# Patient Record
Sex: Female | Born: 1940 | Race: White | Hispanic: No | Marital: Married | Smoking: Never smoker
Health system: Southern US, Community
[De-identification: ages and names within clinical notes are randomized; demographics above are authoritative.]

## PROBLEM LIST (undated history)

## (undated) DIAGNOSIS — T4145XA Adverse effect of unspecified anesthetic, initial encounter: Secondary | ICD-10-CM

## (undated) DIAGNOSIS — K449 Diaphragmatic hernia without obstruction or gangrene: Secondary | ICD-10-CM

## (undated) DIAGNOSIS — K5790 Diverticulosis of intestine, part unspecified, without perforation or abscess without bleeding: Secondary | ICD-10-CM

## (undated) DIAGNOSIS — Z9889 Other specified postprocedural states: Secondary | ICD-10-CM

## (undated) DIAGNOSIS — I809 Phlebitis and thrombophlebitis of unspecified site: Secondary | ICD-10-CM

## (undated) DIAGNOSIS — M1990 Unspecified osteoarthritis, unspecified site: Secondary | ICD-10-CM

## (undated) DIAGNOSIS — R112 Nausea with vomiting, unspecified: Secondary | ICD-10-CM

## (undated) DIAGNOSIS — T8859XA Other complications of anesthesia, initial encounter: Secondary | ICD-10-CM

## (undated) DIAGNOSIS — M62838 Other muscle spasm: Secondary | ICD-10-CM

## (undated) DIAGNOSIS — I10 Essential (primary) hypertension: Secondary | ICD-10-CM

## (undated) DIAGNOSIS — M810 Age-related osteoporosis without current pathological fracture: Secondary | ICD-10-CM

## (undated) HISTORY — PX: JOINT REPLACEMENT: SHX530

## (undated) HISTORY — DX: Other muscle spasm: M62.838

## (undated) HISTORY — PX: REPLACEMENT TOTAL KNEE: SUR1224

## (undated) HISTORY — PX: TONSILLECTOMY: SUR1361

---

## 1999-03-04 ENCOUNTER — Other Ambulatory Visit: Admission: RE | Admit: 1999-03-04 | Discharge: 1999-03-04 | Payer: Self-pay | Admitting: *Deleted

## 1999-11-07 ENCOUNTER — Other Ambulatory Visit: Admission: RE | Admit: 1999-11-07 | Discharge: 1999-11-07 | Payer: Self-pay | Admitting: *Deleted

## 1999-11-07 ENCOUNTER — Encounter (INDEPENDENT_AMBULATORY_CARE_PROVIDER_SITE_OTHER): Payer: Self-pay | Admitting: Specialist

## 1999-12-03 ENCOUNTER — Encounter: Admission: RE | Admit: 1999-12-03 | Discharge: 2000-01-28 | Payer: Self-pay | Admitting: Family Medicine

## 2001-05-19 ENCOUNTER — Other Ambulatory Visit: Admission: RE | Admit: 2001-05-19 | Discharge: 2001-05-19 | Payer: Self-pay | Admitting: *Deleted

## 2001-05-31 ENCOUNTER — Ambulatory Visit (HOSPITAL_COMMUNITY): Admission: RE | Admit: 2001-05-31 | Discharge: 2001-05-31 | Payer: Self-pay | Admitting: Gastroenterology

## 2001-05-31 ENCOUNTER — Encounter (INDEPENDENT_AMBULATORY_CARE_PROVIDER_SITE_OTHER): Payer: Self-pay | Admitting: Specialist

## 2002-08-14 ENCOUNTER — Other Ambulatory Visit: Admission: RE | Admit: 2002-08-14 | Discharge: 2002-08-14 | Payer: Self-pay | Admitting: *Deleted

## 2003-10-24 ENCOUNTER — Other Ambulatory Visit: Admission: RE | Admit: 2003-10-24 | Discharge: 2003-10-24 | Payer: Self-pay | Admitting: *Deleted

## 2004-10-08 ENCOUNTER — Ambulatory Visit: Payer: Self-pay | Admitting: Family Medicine

## 2004-12-25 ENCOUNTER — Other Ambulatory Visit: Admission: RE | Admit: 2004-12-25 | Discharge: 2004-12-25 | Payer: Self-pay | Admitting: *Deleted

## 2005-06-12 ENCOUNTER — Encounter: Admission: RE | Admit: 2005-06-12 | Discharge: 2005-06-12 | Payer: Self-pay | Admitting: Internal Medicine

## 2005-07-15 ENCOUNTER — Ambulatory Visit: Payer: Self-pay | Admitting: "Endocrinology

## 2005-10-09 ENCOUNTER — Encounter: Admission: RE | Admit: 2005-10-09 | Discharge: 2005-10-09 | Payer: Self-pay | Admitting: Internal Medicine

## 2005-10-12 ENCOUNTER — Ambulatory Visit: Payer: Self-pay | Admitting: "Endocrinology

## 2005-12-29 ENCOUNTER — Other Ambulatory Visit: Admission: RE | Admit: 2005-12-29 | Discharge: 2005-12-29 | Payer: Self-pay | Admitting: *Deleted

## 2006-01-18 ENCOUNTER — Ambulatory Visit: Payer: Self-pay | Admitting: "Endocrinology

## 2006-05-19 ENCOUNTER — Ambulatory Visit: Payer: Self-pay | Admitting: "Endocrinology

## 2006-08-23 ENCOUNTER — Ambulatory Visit: Payer: Self-pay | Admitting: "Endocrinology

## 2006-11-24 ENCOUNTER — Ambulatory Visit: Payer: Self-pay | Admitting: "Endocrinology

## 2007-01-05 ENCOUNTER — Other Ambulatory Visit: Admission: RE | Admit: 2007-01-05 | Discharge: 2007-01-05 | Payer: Self-pay | Admitting: *Deleted

## 2007-06-28 ENCOUNTER — Ambulatory Visit: Payer: Self-pay | Admitting: "Endocrinology

## 2008-02-15 ENCOUNTER — Other Ambulatory Visit: Admission: RE | Admit: 2008-02-15 | Discharge: 2008-02-15 | Payer: Self-pay | Admitting: *Deleted

## 2008-02-22 ENCOUNTER — Ambulatory Visit: Payer: Self-pay | Admitting: "Endocrinology

## 2008-06-19 ENCOUNTER — Ambulatory Visit: Payer: Self-pay | Admitting: "Endocrinology

## 2008-06-21 ENCOUNTER — Encounter: Admission: RE | Admit: 2008-06-21 | Discharge: 2008-06-21 | Payer: Self-pay | Admitting: "Endocrinology

## 2008-12-26 ENCOUNTER — Ambulatory Visit: Payer: Self-pay | Admitting: "Endocrinology

## 2009-05-29 ENCOUNTER — Ambulatory Visit: Payer: Self-pay | Admitting: "Endocrinology

## 2009-11-18 ENCOUNTER — Inpatient Hospital Stay (HOSPITAL_COMMUNITY): Admission: RE | Admit: 2009-11-18 | Discharge: 2009-11-20 | Payer: Self-pay | Admitting: Orthopedic Surgery

## 2010-02-17 ENCOUNTER — Encounter: Admission: RE | Admit: 2010-02-17 | Discharge: 2010-02-17 | Payer: Self-pay | Admitting: Family Medicine

## 2010-04-08 ENCOUNTER — Ambulatory Visit: Payer: Self-pay | Admitting: "Endocrinology

## 2010-11-17 ENCOUNTER — Inpatient Hospital Stay (HOSPITAL_COMMUNITY)
Admission: RE | Admit: 2010-11-17 | Discharge: 2010-11-19 | Payer: Self-pay | Source: Home / Self Care | Attending: Orthopedic Surgery | Admitting: Orthopedic Surgery

## 2011-02-17 LAB — URINALYSIS, ROUTINE W REFLEX MICROSCOPIC
Bilirubin Urine: NEGATIVE
Glucose, UA: NEGATIVE mg/dL
Hgb urine dipstick: NEGATIVE
Ketones, ur: NEGATIVE mg/dL
Nitrite: NEGATIVE
Protein, ur: NEGATIVE mg/dL
Specific Gravity, Urine: 1.012 (ref 1.005–1.030)
Urobilinogen, UA: 0.2 mg/dL (ref 0.0–1.0)
pH: 7.5 (ref 5.0–8.0)

## 2011-02-17 LAB — TYPE AND SCREEN
ABO/RH(D): O POS
Antibody Screen: NEGATIVE

## 2011-02-17 LAB — DIFFERENTIAL
Basophils Absolute: 0.1 10*3/uL (ref 0.0–0.1)
Basophils Relative: 1 % (ref 0–1)
Eosinophils Absolute: 0.1 10*3/uL (ref 0.0–0.7)
Eosinophils Relative: 1 % (ref 0–5)
Lymphocytes Relative: 20 % (ref 12–46)
Lymphs Abs: 1.2 10*3/uL (ref 0.7–4.0)
Monocytes Absolute: 0.8 10*3/uL (ref 0.1–1.0)
Monocytes Relative: 12 % (ref 3–12)
Neutro Abs: 4.2 10*3/uL (ref 1.7–7.7)
Neutrophils Relative %: 66 % (ref 43–77)

## 2011-02-17 LAB — BASIC METABOLIC PANEL
BUN: 12 mg/dL (ref 6–23)
BUN: 7 mg/dL (ref 6–23)
CO2: 26 mEq/L (ref 19–32)
CO2: 27 mEq/L (ref 19–32)
Calcium: 8.1 mg/dL — ABNORMAL LOW (ref 8.4–10.5)
Calcium: 9.4 mg/dL (ref 8.4–10.5)
Chloride: 101 mEq/L (ref 96–112)
Chloride: 102 mEq/L (ref 96–112)
Creatinine, Ser: 0.8 mg/dL (ref 0.4–1.2)
Creatinine, Ser: 0.9 mg/dL (ref 0.4–1.2)
GFR calc Af Amer: >60 mL/min (ref >60)
GFR calc Af Amer: >60 mL/min (ref >60)
GFR calc non Af Amer: >60 mL/min (ref >60)
GFR calc non Af Amer: >60 mL/min (ref >60)
Glucose, Bld: 100 mg/dL — ABNORMAL HIGH (ref 70–99)
Glucose, Bld: 143 mg/dL — ABNORMAL HIGH (ref 70–99)
Potassium: 3.7 mEq/L (ref 3.5–5.1)
Potassium: 3.9 mEq/L (ref 3.5–5.1)
Sodium: 134 mEq/L — ABNORMAL LOW (ref 135–145)
Sodium: 135 mEq/L (ref 135–145)

## 2011-02-17 LAB — CBC
HCT: 32.5 % — ABNORMAL LOW (ref 36.0–46.0)
HCT: 39.5 % (ref 36.0–46.0)
Hemoglobin: 10.8 g/dL — ABNORMAL LOW (ref 12.0–15.0)
Hemoglobin: 13.3 g/dL (ref 12.0–15.0)
MCH: 30.6 pg (ref 26.0–34.0)
MCH: 30.9 pg (ref 26.0–34.0)
MCHC: 33.2 g/dL (ref 30.0–36.0)
MCHC: 33.7 g/dL (ref 30.0–36.0)
MCV: 91.6 fL (ref 78.0–100.0)
MCV: 92.1 fL (ref 78.0–100.0)
Platelets: 214 10*3/uL (ref 150–400)
Platelets: 217 10*3/uL (ref 150–400)
Platelets: 267 10*3/uL (ref 150–400)
RBC: 3.49 MIL/uL — ABNORMAL LOW (ref 3.87–5.11)
RBC: 3.53 MIL/uL — ABNORMAL LOW (ref 3.87–5.11)
RBC: 4.31 MIL/uL (ref 3.87–5.11)
RDW: 14.2 % (ref 11.5–15.5)
RDW: 14.4 % (ref 11.5–15.5)
RDW: 14.4 % (ref 11.5–15.5)
WBC: 5.1 10*3/uL (ref 4.0–10.5)
WBC: 5.6 10*3/uL (ref 4.0–10.5)
WBC: 6.3 10*3/uL (ref 4.0–10.5)

## 2011-02-17 LAB — ANAEROBIC CULTURE

## 2011-02-17 LAB — SURGICAL PCR SCREEN
MRSA, PCR: NEGATIVE
Staphylococcus aureus: NEGATIVE

## 2011-02-17 LAB — APTT: aPTT: 28 seconds (ref 24–37)

## 2011-02-17 LAB — WOUND CULTURE: Culture: NO GROWTH

## 2011-02-17 LAB — GRAM STAIN

## 2011-02-17 LAB — PROTIME-INR
INR: 1.02 (ref 0.00–1.49)
INR: 1.06 (ref 0.00–1.49)
INR: 1.59 — ABNORMAL HIGH (ref 0.00–1.49)
Prothrombin Time: 13.6 seconds (ref 11.6–15.2)

## 2011-03-10 LAB — DIFFERENTIAL
Basophils Relative: 1 % (ref 0–1)
Lymphocytes Relative: 19 % (ref 12–46)
Lymphs Abs: 1.3 10*3/uL (ref 0.7–4.0)
Monocytes Relative: 8 % (ref 3–12)
Neutro Abs: 4.8 10*3/uL (ref 1.7–7.7)
Neutrophils Relative %: 69 % (ref 43–77)

## 2011-03-10 LAB — URINALYSIS, ROUTINE W REFLEX MICROSCOPIC
Bilirubin Urine: NEGATIVE
Nitrite: NEGATIVE
Specific Gravity, Urine: 1.015 (ref 1.005–1.030)
Urobilinogen, UA: 0.2 mg/dL (ref 0.0–1.0)

## 2011-03-10 LAB — CBC
HCT: 34.7 % — ABNORMAL LOW (ref 36.0–46.0)
HCT: 35 % — ABNORMAL LOW (ref 36.0–46.0)
Hemoglobin: 12.3 g/dL (ref 12.0–15.0)
MCHC: 34.5 g/dL (ref 30.0–36.0)
MCHC: 34.8 g/dL (ref 30.0–36.0)
MCHC: 35.1 g/dL (ref 30.0–36.0)
MCV: 92.7 fL (ref 78.0–100.0)
MCV: 92.9 fL (ref 78.0–100.0)
Platelets: 221 10*3/uL (ref 150–400)
RBC: 4.4 MIL/uL (ref 3.87–5.11)
RDW: 13.4 % (ref 11.5–15.5)
RDW: 13.5 % (ref 11.5–15.5)
WBC: 6.9 10*3/uL (ref 4.0–10.5)

## 2011-03-10 LAB — BASIC METABOLIC PANEL
CO2: 29 mEq/L (ref 19–32)
Calcium: 9.5 mg/dL (ref 8.4–10.5)
Calcium: 8.4 mg/dL (ref 8.4–10.5)
Creatinine, Ser: 0.91 mg/dL (ref 0.4–1.2)
GFR calc Af Amer: >60 mL/min (ref >60)
GFR calc non Af Amer: >60 mL/min (ref >60)
Glucose, Bld: 133 mg/dL — ABNORMAL HIGH (ref 70–99)
Potassium: 4.2 mEq/L (ref 3.5–5.1)
Sodium: 134 mEq/L — ABNORMAL LOW (ref 135–145)

## 2011-03-10 LAB — TYPE AND SCREEN: Antibody Screen: NEGATIVE

## 2011-03-10 LAB — PROTIME-INR: INR: 1.04 (ref 0.00–1.49)

## 2011-03-10 LAB — APTT: aPTT: 31 seconds (ref 24–37)

## 2011-03-31 ENCOUNTER — Other Ambulatory Visit: Payer: Self-pay | Admitting: *Deleted

## 2011-03-31 ENCOUNTER — Encounter: Payer: Self-pay | Admitting: *Deleted

## 2011-03-31 DIAGNOSIS — M810 Age-related osteoporosis without current pathological fracture: Secondary | ICD-10-CM | POA: Insufficient documentation

## 2011-03-31 DIAGNOSIS — E049 Nontoxic goiter, unspecified: Secondary | ICD-10-CM | POA: Insufficient documentation

## 2011-03-31 DIAGNOSIS — E229 Hyperfunction of pituitary gland, unspecified: Secondary | ICD-10-CM | POA: Insufficient documentation

## 2011-03-31 DIAGNOSIS — E782 Mixed hyperlipidemia: Secondary | ICD-10-CM

## 2011-03-31 DIAGNOSIS — E785 Hyperlipidemia, unspecified: Secondary | ICD-10-CM | POA: Insufficient documentation

## 2011-04-29 ENCOUNTER — Ambulatory Visit (INDEPENDENT_AMBULATORY_CARE_PROVIDER_SITE_OTHER): Payer: BC Managed Care – PPO | Admitting: "Endocrinology

## 2011-04-29 DIAGNOSIS — E049 Nontoxic goiter, unspecified: Secondary | ICD-10-CM

## 2011-04-29 DIAGNOSIS — M810 Age-related osteoporosis without current pathological fracture: Secondary | ICD-10-CM

## 2011-04-29 DIAGNOSIS — E559 Vitamin D deficiency, unspecified: Secondary | ICD-10-CM

## 2011-04-29 DIAGNOSIS — E211 Secondary hyperparathyroidism, not elsewhere classified: Secondary | ICD-10-CM

## 2011-04-29 NOTE — Patient Instructions (Signed)
Please increase increase your dietary calcium as you are able to.

## 2011-06-27 NOTE — Progress Notes (Signed)
Chief complaint: Followup osteoporosis, secondary hyperparathyroidism, hypocalcemia, hyperlipidemia, and goiter  History of present illness: The patient is a 70 year old Caucasian woman. 1. the patient was referred to me on 8/09/ 06 for evaluation of osteoporosis by her primary care provider Dr. [name] M.D. She developed bilateral hip joint pains in the late 1990s. In 1996, Dr. [name] of [facility], ordered a bone mineral density study. That study showed significant osteoporosis in the spine and in both hips.  The patient was treated with calcium carbonate and with a multivitamin that included vitamin D. Despite taking these medications and having a reasonably robust exercise regimen, the patient's osteoporosis worsened. In 1999 Dr. [name]  performed bilateral hip replacements. Treatment with Fosamax was begun at about that time. In the intervening years bone mineral density had not changed significantly. She was treated with Boniva for several months, but just forgot to take it. Her past medical history revealed that she had had arthritis in many joints for many years. She had undergone natural menopause about 20 years prior. She did take estrogen for one or 2 years. Her surgeries were bilateral hip or placements and bilateral vein stripping was. 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 Family history revealed that 5 of her 6 siblings had osteoporosis. Two of those had celiac disease when younger. A sister and brother have had thyroid problems as well. Her physical examination was completely unremarkable. Laboratory data revealed that her PTH was 75.5, which was at the upper limit of normal. Her calcium was 8.7, which was in the lower half. Her 25 hydroxy vitamin D was 39 which was normal. Her 125 vitamin D was 51 which was also normal. I felt that she had relative hyperparathyroidism due to relatively low calcium and vitamin D intake. I asked  her to increase her calcium and vitamin D intake. On subsequent testing on 09/30/2005, her PTH was 37.0, calcium 9.3, 25-vitamin D 47, and 1,25-vitamin D 55.  2. In the interim the patient has has done well overall. Because it was unclear how long she should really remain on Fosamax treatment, I recommended that she obtain a second opinion from Dr. Gwendolyn Grant at [facility]. She saw Dr. Valarie Cones in June of 2009. He he recommended that she not be on Fosamax or Boniva treatment. He felt that she had derived all of the benefit from Fosamax that she could. He recommended that she continue her calcium, vitamin D, and exercise. The patient's last visit here was on 04/08/2010. Since then she's had a left hip revision procedure performed. She continues to have followup visits with Dr. [name] in orthopedics. A bone mineral density study performed on 02/16/2011 showed that her bone mineral density probably declined about 2.4-3.7% in the last 3 years.  3.PROS: Constitutional: The patient feels well, is healthy, and has no significant complaints. Eyes: Vision is good. There are no significant eye complaints. Neck: The patient has no complaints of anterior neck swelling, soreness, tenderness,  pressure, discomfort, or difficulty swallowing. She continues to have pains in her posterior neck. Heart: Heart rate increases with exercise or other physical activity. The patient has no complaints of palpitations, irregular heat beats, chest pain, or chest pressure. Gastrointestinal: Bowel movents seem normal. The patient has no complaints of excessive hunger, acid reflux, upset stomach, stomach aches or pains, diarrhea, or constipation. Legs: Muscle mass and strength seem normal. There are no complaints of numbness, tingling,  or burning. She has occasional leg muscle pains after walking. No edema is noted. Feet: There are no obvious foot problems. There are no complaints of numbness,  tingling, burning, or pain. No edema is noted.  PMFSH: 1. The patient had a recent trip to [place]. 2. 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  ROS: There are no other significant problems involving her other six body systems.She did have some dental work done two hours before this visit.  PHYSICAL EXAM: BP 147/85  Pulse 59  Wt 160 lb 4.8 oz (72.712 kg) Constitutional: The patient looks healthy and appears physically and emotionally well. The patient is alert, bright, and very mentally sharp. Eyes: There is no arcus or proptosis.  Mouth: The oropharynx appears normal. The tongue appears normal. There is normal oral moisture. There is no obvious gingivitis. Neck: There are no bruits present. The thyroid gland appears normal in size. The thyroid gland is approximately 20 grams in size. The consistency of the thyroid gland is normal. There is no thyroid tenderness to palpation.The trapezius muscles of the shoulders and the nuchal cords of the posterior neck are quite tight. Lungs: The lungs are clear. Air movement is good. Heart: The heart rhythm and rate appear normal. Heart sounds S1 and S2 are normal. I do not appreciate any pathologic heart murmurs. Abdomen: The abdominal size is normal. Bowel sounds are normal. The abdomen is soft and non-tender. There is no obviously palpable hepatomegaly, splenomegaly, or other masses.  Arms: Muscle mass appears appropriate for age.  Hands: There is no obvious tremor. Phalangeal and metacarpophalangeal joints appear normal. Palms are normal. Legs: Muscle mass appears appropriate for age. There is no edema.  Neurologic: Muscle strength is normal for age and gender  in both the upper and the lower extremities. Muscle tone appears normal. Sensation to touch is normal in the legs and feet.   Labs: 03/27/2011 - Normal TFTs, PTH, calcium, 25-vitamin D, and 1.25-Vitamin D  BMD: The patient's bone mineral density study performed on 02/16/2011 show that she  has lost approximately 2.4-3.7% of mineral density in the past year.  ASSESSMENT: 1. Osteoporosis: The patient osteoporosis is slightly worse. However, based on recommendations of her bone mineral density specialist, Dr. Valarie Cones, we have not resumed treatment with bisphosphonates. I did recommend that she contact Dr. Herma Ard office and arrange a FU appointment with him. 2. Vitamin D deficiency: Vitamin D levels are currently stable. 3. Secondary hyperparathyroidism: Her parathyroid hormone levels are now well-controlled. 4. Goiter: Thyroid size is stable. She remains euthyroid. 5. Hypocalcemia: The patient's serum calcium remains in the third quartile. I would like to see it somewhat higher over time to provide her adequate amounts of calcium in case of GI illness.  PLAN: 1. Diagnostic: Will repeat her laboratory values in 6 months. 2. Therapeutic: The patient will contact Dr. Valarie Cones to see if he would like to have a followup appointment. 3. Patient education: We discussed the current recommendations from the bone mineral density specialisst as to calcium and vitamin D intake. I've asked her to try to increase the amount of calcium which she obtains from natural sources. I also showed her some cervical spine and stretching exercises that she can use at home. 4. Follow-up: The patient will have a followup appointment in 6 months.  Level of Service: This visit lasted in excess of 40 minutes. More than 50% of the visit was devoted to counseling.

## 2011-07-19 ENCOUNTER — Encounter: Payer: Self-pay | Admitting: "Endocrinology

## 2011-10-26 ENCOUNTER — Ambulatory Visit: Payer: BC Managed Care – PPO | Admitting: "Endocrinology

## 2011-10-28 ENCOUNTER — Ambulatory Visit: Payer: BC Managed Care – PPO | Admitting: "Endocrinology

## 2011-11-01 ENCOUNTER — Emergency Department (HOSPITAL_COMMUNITY): Payer: BC Managed Care – PPO

## 2011-11-01 ENCOUNTER — Observation Stay (HOSPITAL_COMMUNITY)
Admission: EM | Admit: 2011-11-01 | Discharge: 2011-11-02 | Disposition: A | Discharge status: Home / Self Care | Payer: BC Managed Care – PPO | Attending: Orthopedic Surgery | Admitting: Orthopedic Surgery

## 2011-11-01 ENCOUNTER — Encounter (HOSPITAL_COMMUNITY): Payer: Self-pay | Admitting: Emergency Medicine

## 2011-11-01 DIAGNOSIS — S73005A Unspecified dislocation of left hip, initial encounter: Secondary | ICD-10-CM

## 2011-11-01 DIAGNOSIS — M25559 Pain in unspecified hip: Secondary | ICD-10-CM | POA: Insufficient documentation

## 2011-11-01 DIAGNOSIS — X500XXA Overexertion from strenuous movement or load, initial encounter: Secondary | ICD-10-CM | POA: Insufficient documentation

## 2011-11-01 DIAGNOSIS — T84029A Dislocation of unspecified internal joint prosthesis, initial encounter: Principal | ICD-10-CM | POA: Insufficient documentation

## 2011-11-01 DIAGNOSIS — Y92009 Unspecified place in unspecified non-institutional (private) residence as the place of occurrence of the external cause: Secondary | ICD-10-CM | POA: Insufficient documentation

## 2011-11-01 DIAGNOSIS — Z23 Encounter for immunization: Secondary | ICD-10-CM | POA: Insufficient documentation

## 2011-11-01 HISTORY — DX: Diaphragmatic hernia without obstruction or gangrene: K44.9

## 2011-11-01 MED ORDER — ONDANSETRON HCL 4 MG/2ML IJ SOLN
4.0000 mg | Freq: Once | INTRAMUSCULAR | Status: AC
  Administered 2011-11-01: 4 mg via INTRAVENOUS
  Filled 2011-11-01: qty 2

## 2011-11-01 MED ORDER — WHITE PETROLATUM GEL
Status: AC
  Administered 2011-11-01: 23:00:00
  Filled 2011-11-01: qty 5

## 2011-11-01 MED ORDER — PROPOFOL 10 MG/ML IV EMUL
5.0000 ug/kg/min | INTRAVENOUS | Status: DC
  Administered 2011-11-01: 170 mg via INTRAVENOUS
  Filled 2011-11-01: qty 40

## 2011-11-01 MED ORDER — PROMETHAZINE HCL 12.5 MG PO TABS: 12.5000 mg | ORAL_TABLET | Freq: Four times a day (QID) | ORAL | Status: DC | PRN

## 2011-11-01 MED ORDER — MORPHINE SULFATE 4 MG/ML IJ SOLN
4.0000 mg | Freq: Once | INTRAMUSCULAR | Status: AC
  Administered 2011-11-01: 4 mg via INTRAVENOUS

## 2011-11-01 MED ORDER — MORPHINE SULFATE 4 MG/ML IJ SOLN
4.0000 mg | Freq: Once | INTRAMUSCULAR | Status: AC
  Administered 2011-11-01: 4 mg via INTRAVENOUS
  Filled 2011-11-01: qty 1

## 2011-11-01 MED ORDER — PROMETHAZINE HCL 25 MG/ML IJ SOLN: 12.5000 mg | Freq: Four times a day (QID) | INTRAMUSCULAR | Status: DC | PRN

## 2011-11-01 MED ORDER — HYDROCODONE-ACETAMINOPHEN 5-325 MG PO TABS: 1.0000 | ORAL_TABLET | ORAL | Status: DC | PRN

## 2011-11-01 MED ORDER — ALUM & MAG HYDROXIDE-SIMETH 200-200-20 MG/5ML PO SUSP: 30.0000 mL | Freq: Four times a day (QID) | ORAL | Status: DC | PRN

## 2011-11-01 MED ORDER — ACETAMINOPHEN 325 MG PO TABS
650.0000 mg | ORAL_TABLET | Freq: Four times a day (QID) | ORAL | Status: DC | PRN
  Administered 2011-11-01: 650 mg via ORAL
  Filled 2011-11-01: qty 2

## 2011-11-01 MED ORDER — ACETAMINOPHEN 650 MG RE SUPP: 650.0000 mg | Freq: Four times a day (QID) | RECTAL | Status: DC | PRN

## 2011-11-01 NOTE — ED Notes (Signed)
5F foley cath placed with assistance from [place], EMT

## 2011-11-01 NOTE — ED Notes (Signed)
Per [place], gave patient, swabs, toothbrush, toothpaste and a small cup of water to use to rinse her mouth out.  No swallowing at all.

## 2011-11-01 NOTE — H&P (Signed)
Danielle Carson is an 70 y.o. female.   Chief Complaint: L hip pain  HPI: 70 yo F h/o L hip revision arthroplasty by Dr. Turner Carson 2 yrs ago.  Hiking today and leaned over to pick a rock out of shoe, felt pop and pain in L hip.  Transported to ED unable to walk.  Pain is moderate to severe.  History reviewed. No pertinent past medical history.  Past Surgical History  Procedure Date  . Joint replacement     bilat hip replacements and left hip revision    History reviewed. No pertinent family history. Social History:  reports that she has never smoked. She does not have any smokeless tobacco history on file. She reports that she drinks alcohol. She reports that she does not use illicit drugs.  Allergies:  Allergies  Allergen Reactions  . Aspirin     On very high doses    Medications Prior to Admission  Medication Dose Route Frequency Provider Last Rate Last Dose  . morphine 4 MG/ML injection 4 mg  4 mg Intravenous Once Danielle Chang, NP   4 mg at 11/01/11 1605  . morphine 4 MG/ML injection 4 mg  4 mg Intravenous Once Danielle Chang, NP   4 mg at 11/01/11 1748  . ondansetron (ZOFRAN) injection 4 mg  4 mg Intravenous Once Danielle Chang, NP   4 mg at 11/01/11 1604  . ondansetron (ZOFRAN) injection 4 mg  4 mg Intravenous Once Danielle Chang, NP   4 mg at 11/01/11 1748  . propofol (DIPRIVAN) 10 MG/ML infusion  5-70 mcg/kg/min Intravenous Titrated Danielle Chang, NP       Medications Prior to Admission  Medication Sig Dispense Refill  . calcium-vitamin D (OSCAL WITH D) 500-200 MG-UNIT per tablet Take 1 tablet by mouth 2 (two) times daily.        . Multiple Vitamin (MULTIVITAMIN) tablet Take 1 tablet by mouth daily.          No results found for this or any previous visit (from the past 48 hour(s)). Dg Hip Complete Left  11/01/2011  *RADIOLOGY REPORT*  Clinical Data: Left hip pain with popping.  LEFT HIP - COMPLETE 2+ VIEW  Comparison: 11/17/2010.  Findings:  Left hip arthroplasty.  The left femoral head is superiorly positioned with respect to the acetabular component. Obturator rings are poorly evaluated due to positioning.  IMPRESSION:  1.  Dislocated left hip arthroplasty. 2.  Obturator rings are poorly evaluated due to positioning. Difficult to exclude fracture.  Original Report Authenticated By: Danielle Carson, M.D.    Review of Systems  All other systems reviewed and are negative.    Blood pressure 135/66, pulse 85, temperature 97.6 F (36.4 C), temperature source Oral, resp. rate 16, height 5' 8.5" (1.74 m), weight 70.308 kg (155 lb), SpO2 99.00%. Physical Exam  Constitutional: She is oriented to person, place, and time. She appears well-developed and well-nourished.  HENT:  Head: Atraumatic.  Eyes: EOM are normal.  Neck: Neck supple.  Cardiovascular: Intact distal pulses.   Respiratory: Effort normal.  GI: Soft.  Musculoskeletal:       Left hip: She exhibits decreased range of motion, tenderness and swelling.  Neurological: She is alert and oriented to person, place, and time.  Skin: Skin is warm and dry.  Psychiatric: She has a normal mood and affect.     Assessment/Plan L prosthetic hip dislocation  -closed reduction in ED with propofol given by ED  doc -Admit for brace, pain control -Knee immobilizer tonight  Danielle Carson Danielle Carson 11/01/2011, 8:15 PM

## 2011-11-01 NOTE — Op Note (Signed)
NAME:  Danielle Carson, Danielle Carson   ACCOUNT NO.:  [id number]  MEDICAL RECORD NO.:  [id number]  LOCATION:  [id number]                        FACILITY:  [facility]  PHYSICIAN:  Jones Broom, MD    DATE OF BIRTH:  [birth year]  DATE OF PROCEDURE:  11/01/2011 DATE OF DISCHARGE:                              OPERATIVE REPORT   PREOPERATIVE DIAGNOSIS:  Left posterior dislocation, prosthetic hip.  POSTOPERATIVE DIAGNOSIS:  Left posterior dislocation, prosthetic hip.  PROCEDURE PERFORMED:  Closed reduction under conscious sedation.  Left hip posterior dislocation, prosthetic hip.  ATTENDING SURGEON:  Jones Broom, MD  ASSISTANT:  None.  ANESTHESIA:  Conscious sedation with propofol given by the ED physician.  COMPLICATIONS:  None.  DRAINS:  None.  SPECIMENS:  None.  ESTIMATED BLOOD LOSS:  None.  INDICATIONS FOR PROCEDURE:  The patient is a 70 year old female who had a left revision total hip arthroplasty done 2 years ago by Dr. [name].  She was doing well with that until today when she was hiking and bent over to pick up a rock out of her boot.  She can forward into internally rotated position, felt a pop and immediate pain.  She was seen in the emergency department where she was diagnosed with a left hip dislocation.  I was called for evaluation and management.  PROCEDURE:  The patient was identified in the emergency department.  I verified diagnosis based on x-rays and clinical exam.  She had received conscious sedation with propofol given by the emergency department physician.  Check complete relaxation.  With flexion traction and gentle internal subsequently external rotation, I was able to reduce the hip without significant difficulty.  After the reduction, the leg lengths were equal and stability was not extensively tested but not felt to be quite stable in a neutral position.  She was then allowed to awaken from her sedation and placed in a knee immobilizer.  She was  observed in the emergency department.  POSTOPERATIVE PLAN:  She will be admitted overnight for observation and for brace fitting in the morning.  The case will be discussed with Dr. [name].     Jones Broom, MD     JC/MEDQ  D:  11/01/2011  T:  11/01/2011  Job:  [id number]

## 2011-11-01 NOTE — ED Notes (Signed)
Report given to Hui, RN. 

## 2011-11-01 NOTE — Op Note (Signed)
Dictated op report [id number] Closed reduction L hip with conscious sedation.

## 2011-11-01 NOTE — ED Notes (Signed)
Ortho applying left knee immobilizer

## 2011-11-01 NOTE — ED Provider Notes (Signed)
History     CSN: [id number] Arrival date & time: 11/01/2011  2:20 PM   First MD Initiated Contact with Patient 11/01/11 1511      Chief Complaint  Patient presents with  . Hip Pain    left hip pain after "taking a wrong step" denies fall. states bilateral hips replaced with a left revision 2 years ago.     Patient is a 70 y.o. female presenting with hip pain. The history is provided by the patient.  Hip Pain This is a new problem. The current episode started today. The problem occurs constantly. The problem has been gradually worsening. She has tried nothing for the symptoms.  Reports she was hiking today when she felt a stone lodged in the bottom of her (R) shoe. She put her left foot up on a step with her foot slightly inverted to reach down and remove the stone. When she bent over she felt a severe pain in her (L) hip and was unable to move, stand or walk.   History reviewed. No pertinent past medical history.  Past Surgical History  Procedure Date  . Joint replacement     bilat hip replacements and left hip revision    History reviewed. No pertinent family history.  History  Substance Use Topics  . Smoking status: Never Smoker   . Smokeless tobacco: Not on file  . Alcohol Use: Yes    OB History    Grav Para Term Preterm Abortions TAB SAB Ect Mult Living                  Review of Systems  Constitutional: Negative.   HENT: Negative.   Eyes: Negative.   Respiratory: Negative.   Cardiovascular: Negative.   Gastrointestinal: Negative.   Genitourinary: Negative.   Musculoskeletal: Negative.   Skin: Negative.   Neurological: Negative.   Hematological: Negative.   Psychiatric/Behavioral: Negative.     Allergies  Aspirin  Home Medications   Current Outpatient Rx  Name Route Sig Dispense Refill  . CALCIUM CARBONATE-VITAMIN D 500-200 MG-UNIT PO TABS Oral Take 1 tablet by mouth 2 (two) times daily.      Marland Kitchen GLUCOSAMINE-CHONDROITIN 500-400 MG PO TABS Oral Take  1 tablet by mouth 3 (three) times daily. OTC     . ONE-DAILY MULTI VITAMINS PO TABS Oral Take 1 tablet by mouth daily.      Marland Kitchen VITAMIN B-1 50 MG PO TABS Oral Take 50 mg by mouth daily. OTC       BP 129/61  Pulse 78  Temp(Src) 97.6 F (36.4 C) (Oral)  Resp 14  Ht 5' 8.5" (1.74 m)  Wt 155 lb (70.308 kg)  BMI 23.23 kg/m2  SpO2 99%  Physical Exam  Constitutional: She is oriented to person, place, and time. She appears well-developed and well-nourished.  HENT:  Head: Normocephalic and atraumatic.  Eyes: Conjunctivae are normal.  Neck: Normal range of motion.  Cardiovascular: Normal rate and regular rhythm.   Pulmonary/Chest: Effort normal and breath sounds normal.  Abdominal: Soft. Bowel sounds are normal.  Musculoskeletal: Normal range of motion.       Left hip: She exhibits tenderness and swelling.       Legs: Neurological: She is alert and oriented to person, place, and time.  Skin: Skin is warm and dry.  Psychiatric: She has a normal mood and affect.    ED Course  Procedures  Pt's pain has been well managed w/ IV Morphine. Xray reveals dislocated (L) hip  arthroplasty. Will consult w/ pt's orthopedic MD for plan.  2030:  Spoke w/ Dr Ave Filter (on call for Dr [name]). He will be in to see. Ask pt be prepared for conscience sedation and consented for "Closed reduction(L) hip". Discussed plan w/ pt and spouse who are both agreeable.  2130:  Dr Ave Filter performed successful closed reduction under conscience sedation. Pt tolerated well. He will admit.  Labs Reviewed - No data to display Dg Hip Complete Left  11/01/2011  *RADIOLOGY REPORT*  Clinical Data: Left hip pain with popping.  LEFT HIP - COMPLETE 2+ VIEW  Comparison: 11/17/2010.  Findings: Left hip arthroplasty.  The left femoral head is superiorly positioned with respect to the acetabular component. Obturator rings are poorly evaluated due to positioning.  IMPRESSION:  1.  Dislocated left hip arthroplasty. 2.  Obturator rings  are poorly evaluated due to positioning. Difficult to exclude fracture.  Original Report Authenticated By: Reyes Ivan, M.D.     No diagnosis found.    MDM  Dislocated (L) hip arthroplasty w/ successful closed reduction. Pt to be admitted.       Leanne Chang, NP 11/01/11 2210  Leanne Chang, NP 11/01/11 2211

## 2011-11-02 ENCOUNTER — Encounter (HOSPITAL_COMMUNITY): Payer: Self-pay | Admitting: *Deleted

## 2011-11-02 MED ORDER — INFLUENZA VIRUS VACC SPLIT PF IM SUSP: 0.5000 mL | INTRAMUSCULAR | Status: DC

## 2011-11-02 MED ORDER — INFLUENZA VIRUS VACC SPLIT PF IM SUSP
0.5000 mL | INTRAMUSCULAR | Status: AC
  Administered 2011-11-02: 0.5 mL via INTRAMUSCULAR
  Filled 2011-11-02: qty 0.5

## 2011-11-02 NOTE — Progress Notes (Signed)
Physical Therapy Evaluation Patient Details Name: Danielle Carson MRN: [id number] DOB: 1941-06-01 Today's Date: 11/02/2011  Problem List:  Patient Active Problem List  Diagnoses  . Osteoporosis  . Other and unspecified anterior pituitary hyperfunction  . Goiter, unspecified  . Mixed hyperlipidemia    Past Medical History:  Past Medical History  Diagnosis Date  . Coronary artery disease   . Hiatal hernia    Past Surgical History:  Past Surgical History  Procedure Date  . Joint replacement     bilat hip replacements and left hip revision    PT Assessment/Plan/Recommendation PT Assessment Clinical Impression Statement: Pt presents with a dislocated left THA along with the following impairments/deficits listed below and therapy diagnosis. Pt is at a mod Independent/supervision level for all mobility with a SPC.  PT Recommendation/Assessment: Patent does not need any further PT services No Skilled PT: All education completed;Patient will have necessary level of assist by caregiver at discharge;Patient is modified independent with all activity/mobility PT Goals     PT Evaluation Precautions/Restrictions  Restrictions Weight Bearing Restrictions: Yes LLE Weight Bearing: Weight bearing as tolerated Prior Functioning  Home Living Lives With: Spouse Receives Help From: Family Type of Home: House Home Layout: One level Home Access: Stairs to enter Entrance Stairs-Rails: None Secretary/administrator of Steps: 2 Bathroom Shower/Tub: Health visitor: Standard Bathroom Accessibility: Yes How Accessible: Accessible via walker Home Adaptive Equipment: Bedside commode/3-in-1 Prior Function Level of Independence: Independent with basic ADLs;Independent with gait;Independent with transfers Able to Take Stairs?: Yes Driving: Yes Vocation: Full time employment Cognition Cognition Arousal/Alertness: Awake/alert Overall Cognitive Status: Appears within  functional limits for tasks assessed Sensation/Coordination Sensation Light Touch: Appears Intact Extremity Assessment RLE Assessment RLE Assessment: Within Functional Limits LLE Assessment LLE Assessment: Exceptions to WFL LLE AROM (degrees) Overall AROM Left Lower Extremity: Deficits;Due to precautions;Due to pain (Knee and Ankle WFL) LLE Strength LLE Overall Strength: Deficits;Due to precautions;Due to pain (Ankle and knee WFL) Mobility (including Balance) Bed Mobility Bed Mobility: Yes Supine to Sit: 6: Modified independent (Device/Increase time) Transfers Transfers: Yes Sit to Stand: 6: Modified independent (Device/Increase time);From bed Stand to Sit: 6: Modified independent (Device/Increase time);To chair/3-in-1 Ambulation/Gait Ambulation/Gait: Yes Ambulation/Gait Assistance: 4: Min assist;6: Modified independent (Device/Increase time) Ambulation/Gait Assistance Details (indicate cue type and reason): Min Assist with hand held assist, Mod Independent with cane. VC for sequencing Ambulation Distance (Feet): 200 Feet Assistive device: 1 person hand held assist;Straight cane Gait Pattern: Decreased stance time - left;Decreased hip/knee flexion - left Gait velocity: Decreased gait speed Stairs: Yes Stairs Assistance: 4: Min assist;5: Supervision Stairs Assistance Details (indicate cue type and reason): Assist with balance without cane. Supervision with cane Stair Management Technique: No rails;With cane;Step to pattern Number of Stairs: 2     Exercise  Total Joint Exercises Heel Slides: AROM;Strengthening;Left;10 reps;Supine Hip ABduction/ADduction: AROM;Strengthening;Left;10 reps;Supine Straight Leg Raises: AROM;Strengthening;Left;10 reps;Supine End of Session PT - End of Session Equipment Utilized During Treatment: Gait belt Activity Tolerance: Patient tolerated treatment well Patient left: in chair Nurse Communication: Mobility status for transfers;Mobility status  for ambulation General Behavior During Session: [facility] for tasks performed Cognition: [facility] for tasks performed  Milana Kidney 11/02/2011, 11:19 AM

## 2011-11-02 NOTE — Discharge Summary (Signed)
Patient ID: Danielle Carson MRN: [id number] DOB/AGE: 03/16/1941 70 y.o.  Admit date: 11/01/2011 Discharge date: 11/02/2011  Admission Diagnoses:  Active Problems:  L THA Dislocation    Discharge Diagnoses:  Same  Past Medical History  Diagnosis Date  . Coronary artery disease   . Hiatal hernia     Surgeries: CR L THA 11/01/11  Consultants:    Discharged Condition: Improved  Hospital Course: Danielle Carson is an 70 y.o. female who was admitted 11/01/2011 for operative treatment of L THA post DL from bending with twist while hiking. Patient has severe unremitting pain that affects sleep, daily activities, and work/hobbies. After pre-op clearance the patient was taken to the operating room on11/25/12 and underwent CR L THA by Dr Jones Broom .  First DL in 19 years will treat with PT education, no bracing unless DL recurs.  Patient was given perioperative antibiotics: Anti-infectives    None       Patient was given sequential compression devices, early ambulation, and chemoprophylaxis to prevent DVT. PT to review post hip precautions.  Patient benefited maximally from hospital stay and there were no complications.    Recent vital signs: Patient Vitals for the past 24 hrs:  BP Temp Temp src Pulse Resp SpO2 Height Weight  11/02/11 0613 111/58 mmHg 98.8 F (37.1 C) Oral 65  18  97 % - -  11/01/11 2225 161/83 mmHg 98.8 F (37.1 C) Oral 83  18  98 % - -  11/01/11 2151 135/66 mmHg - - 73  - 100 % - -  11/01/11 2137 133/72 mmHg - - 70  - 100 % - -  11/01/11 2122 140/64 mmHg - - 71  - 100 % - -  11/01/11 2107 135/68 mmHg - - 72  15  99 % - -  11/01/11 2041 129/61 mmHg - - 78  14  99 % - -  11/01/11 2036 116/55 mmHg - - 71  14  98 % - -  11/01/11 2031 135/65 mmHg - - 71  16  96 % - -  11/01/11 2026 118/66 mmHg - - 83  16  99 % - -  11/01/11 2022 148/71 mmHg - - 90  20  98 % - -  11/01/11 1819 135/66 mmHg - - 85  16  99 % - -  11/01/11 1734 138/72 mmHg  - - 82  16  98 % - -  11/01/11 1554 152/72 mmHg - - 77  16  99 % - -  11/01/11 1432 145/78 mmHg 97.6 F (36.4 C) Oral 84  18  99 % 5' 8.5" (1.74 m) 70.308 kg (155 lb)     Recent laboratory studies: No results found for this basename: WBC:2,HGB:2,HCT:2,PLT:2,NA:2,K:2,CL:2,CO2:2,BUN:2,CREATININE:2,GLUCOSE:2,PT:2,INR:2,CALCIUM,2: in the last 72 hours   Discharge Medications:   Current Discharge Medication List    CONTINUE these medications which have NOT CHANGED   Details  calcium-vitamin D (OSCAL WITH D) 500-200 MG-UNIT per tablet Take 1 tablet by mouth 2 (two) times daily.      glucosamine-chondroitin 500-400 MG tablet Take 1 tablet by mouth 3 (three) times daily. OTC     Multiple Vitamin (MULTIVITAMIN) tablet Take 1 tablet by mouth daily.      thiamine (VITAMIN B-1) 50 MG tablet Take 50 mg by mouth daily. OTC         Diagnostic Studies: Dg Hip Complete Left  11/01/2011  *RADIOLOGY REPORT*  Clinical Data: Left hip pain with popping.  LEFT HIP -  COMPLETE 2+ VIEW  Comparison: 11/17/2010.  Findings: Left hip arthroplasty.  The left femoral head is superiorly positioned with respect to the acetabular component. Obturator rings are poorly evaluated due to positioning.  IMPRESSION:  1.  Dislocated left hip arthroplasty. 2.  Obturator rings are poorly evaluated due to positioning. Difficult to exclude fracture.  Original Report Authenticated By: Reyes Ivan, M.D.   Dg Pelvis Portable  11/01/2011  *RADIOLOGY REPORT*  Clinical Data: Status post reduction of dislocated left hip arthroplasty.  PORTABLE PELVIS  Comparison: Hip film earlier today.  Findings: Left hip arthroplasty now shows normal alignment in the frontal projection.  No evidence of acute fracture.  The rest of the visualized bony pelvis is unremarkable.  IMPRESSION: Normal alignment of left hip arthroplasty after reduction.  Original Report Authenticated By: Reola Calkins, M.D.    Disposition:   Discharge Orders     Future Appointments: Provider: Department: Dept Phone: Center:   01/28/2012 2:00 PM David Stall, MD [facility] [phone] [facility]     Future Orders Please Complete By Expires   Diet - low sodium heart healthy      Call MD / Call 911      Comments:   If you experience chest pain or shortness of breath, CALL 911 and be transported to the hospital emergency room.  If you develope a fever above 101 F, pus (white drainage) or increased drainage or redness at the wound, or calf pain, call your surgeon's office.   Constipation Prevention      Comments:   Drink plenty of fluids.  Prune juice may be helpful.  You may use a stool softener, such as Colace (over the counter) 100 mg twice a day.  Use MiraLax (over the counter) for constipation as needed.   Increase activity slowly as tolerated      Weight Bearing as taught in Physical Therapy      Comments:   Use a walker or crutches as instructed.   Patient may shower      Comments:   You may shower without a dressing once there is no drainage.  Do not wash over the wound.  If drainage remains, cover wound with plastic wrap and then shower.   Driving restrictions      Comments:   OK to drive   Follow the hip precautions as taught in Physical Therapy      DO NOT drive, shower or take a tub bath until instructed by your physician      Discharge instructions      Comments:   Posterior THA precautions, out of work today      Follow-up Information    Follow up with [facility] in 2 weeks.   Contact information:   [facility] [address] [place] [address] [phone]           Signed: Nestor Lewandowsky 11/02/2011, 7:14 AM

## 2011-11-02 NOTE — ED Provider Notes (Signed)
History     CSN: [id number] Arrival date & time: 11/01/2011  2:20 PM   First MD Initiated Contact with Patient 11/01/11 1511      Chief Complaint  Patient presents with  . Hip Pain    left hip pain after "taking a wrong step" denies fall. states bilateral hips replaced with a left revision 2 years ago.    (Consider location/radiation/quality/duration/timing/severity/associated sxs/prior treatment) Patient is a 70 y.o. female presenting with hip pain. The history is provided by the patient.  Hip Pain    History reviewed. No pertinent past medical history.  Past Surgical History  Procedure Date  . Joint replacement     bilat hip replacements and left hip revision    History reviewed. No pertinent family history.  History  Substance Use Topics  . Smoking status: Never Smoker   . Smokeless tobacco: Not on file  . Alcohol Use: Yes    OB History    Grav Para Term Preterm Abortions TAB SAB Ect Mult Living                  Review of Systems  Allergies  Aspirin  Home Medications  No current outpatient prescriptions on file.  BP 135/66  Pulse 73  Temp(Src) 97.6 F (36.4 C) (Oral)  Resp 15  Ht 5' 8.5" (1.74 m)  Wt 155 lb (70.308 kg)  BMI 23.23 kg/m2  SpO2 100%  Physical Exam  ED Course  Procedural sedation Date/Time: 11/02/2011 12:21 AM Performed by: Benjiman Core R. Authorized by: Billee Cashing Consent: Verbal consent obtained. Written consent obtained. Risks and benefits: risks, benefits and alternatives were discussed Consent given by: patient Patient understanding: patient states understanding of the procedure being performed Patient consent: the patient's understanding of the procedure matches consent given Procedure consent: procedure consent matches procedure scheduled Relevant documents: relevant documents present and verified Test results: test results available and properly labeled Site marked: the operative site was  marked Imaging studies: imaging studies available Required items: required blood products, implants, devices, and special equipment available Patient identity confirmed: verbally with patient, arm band and provided demographic data Time out: Immediately prior to procedure a "time out" was called to verify the correct patient, procedure, equipment, support staff and site/side marked as required. Patient sedated: yes Sedation type: moderate (conscious) sedation Sedatives: propofol Analgesia: fentanyl Vitals: Vital signs were monitored during sedation. Comments: 15 minutes of sedation. Returned to baseline. Reduction done by Dr Ave Filter.    (including critical care time)  Labs Reviewed - No data to display Dg Hip Complete Left  11/01/2011  *RADIOLOGY REPORT*  Clinical Data: Left hip pain with popping.  LEFT HIP - COMPLETE 2+ VIEW  Comparison: 11/17/2010.  Findings: Left hip arthroplasty.  The left femoral head is superiorly positioned with respect to the acetabular component. Obturator rings are poorly evaluated due to positioning.  IMPRESSION:  1.  Dislocated left hip arthroplasty. 2.  Obturator rings are poorly evaluated due to positioning. Difficult to exclude fracture.  Original Report Authenticated By: Reyes Ivan, M.D.   Dg Pelvis Portable  11/01/2011  *RADIOLOGY REPORT*  Clinical Data: Status post reduction of dislocated left hip arthroplasty.  PORTABLE PELVIS  Comparison: Hip film earlier today.  Findings: Left hip arthroplasty now shows normal alignment in the frontal projection.  No evidence of acute fracture.  The rest of the visualized bony pelvis is unremarkable.  IMPRESSION: Normal alignment of left hip arthroplasty after reduction.  Original Report Authenticated By: Jodi Marble.  Fredia Sorrow, M.D.     1. Hip dislocation, left       MDM  Procedural sedation done for prostatic hip dislocation. He        Juliet Rude. [name], MD 11/02/11 [phone]

## 2011-11-02 NOTE — ED Provider Notes (Signed)
Medical screLeft prosthetic hip dislocation sedated by me and reduced by Dr. Ave Filter.ening examination/treatment/procedure(s) were conducted as a shared visit with non-physician practitioner(s) and myself.  I personally evaluated the patient during the encounter.   Danielle Carson. [name], MD 11/02/11 [id number]

## 2011-11-30 IMAGING — CR DG HIP 1V PORT*L*
1 series · 1 of 1 positions shown · non-contrast
Comparison: None

CLINICAL DATA: Left hip arthroplasty/revision

PORTABLE LEFT HIP - 1 VIEW

[cross table lat hip]
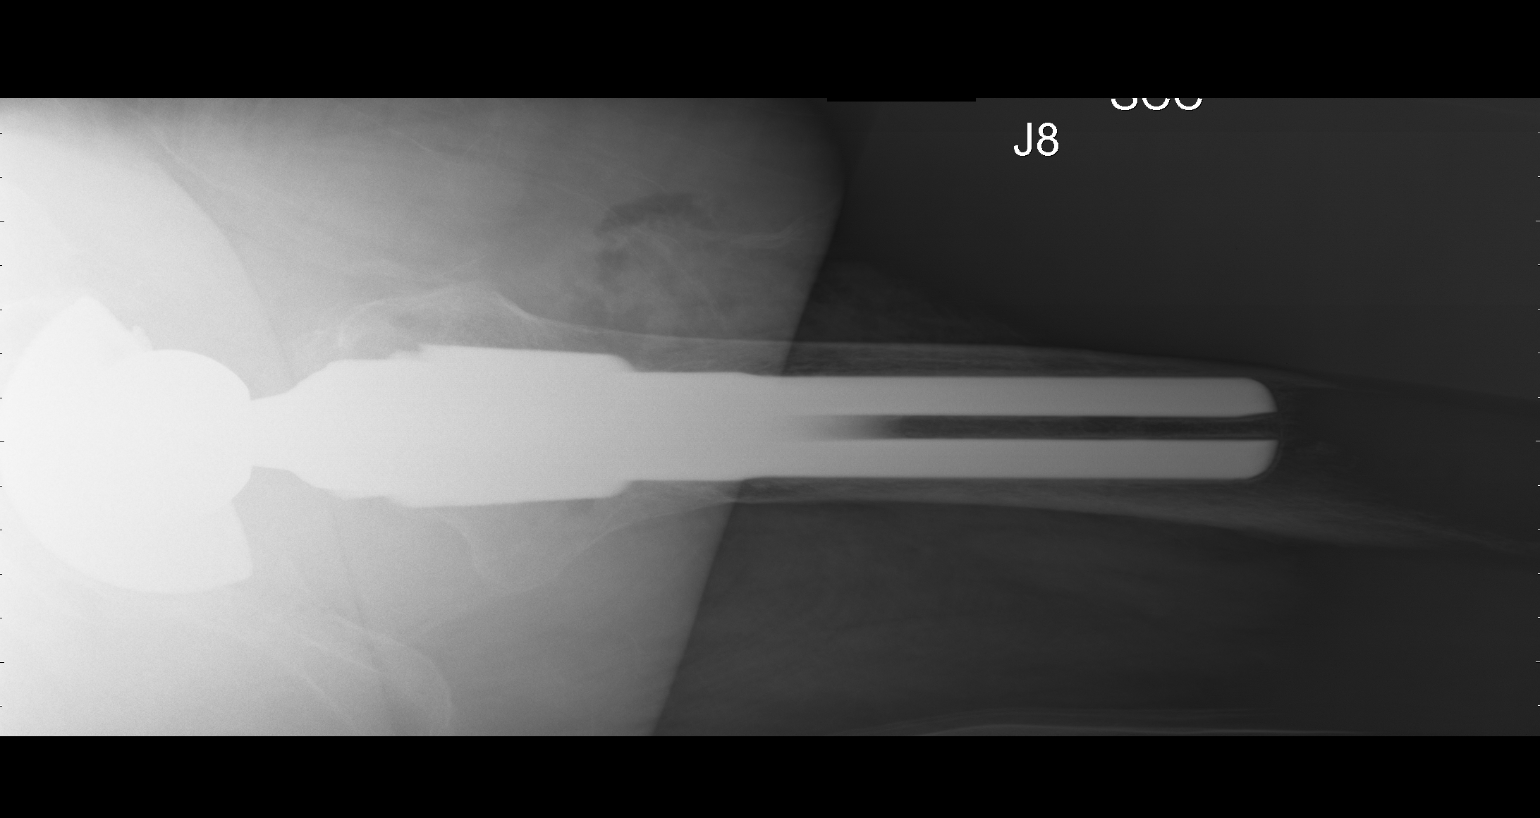

[1 of 1 positions shown; findings below may reference images not displayed]

FINDINGS: A cross-table lateral view in the operating room, shows
good position and alignment of the intramedullary component of the
left hip prosthesis.  No immediate complications are evident.
IMPRESSION: Post left hip revision - good position alignment in the lateral
plane.

## 2011-12-31 ENCOUNTER — Ambulatory Visit: Payer: BC Managed Care – PPO | Admitting: "Endocrinology

## 2012-01-28 ENCOUNTER — Ambulatory Visit: Payer: BC Managed Care – PPO | Admitting: "Endocrinology

## 2012-03-17 ENCOUNTER — Encounter (HOSPITAL_COMMUNITY): Payer: Self-pay

## 2012-03-17 ENCOUNTER — Emergency Department (HOSPITAL_COMMUNITY): Payer: BC Managed Care – PPO

## 2012-03-17 ENCOUNTER — Ambulatory Visit (HOSPITAL_COMMUNITY)
Admission: EM | Admit: 2012-03-17 | Discharge: 2012-03-17 | Disposition: A | Discharge status: Home / Self Care | Payer: BC Managed Care – PPO | Attending: Orthopedic Surgery | Admitting: Orthopedic Surgery

## 2012-03-17 ENCOUNTER — Emergency Department (HOSPITAL_COMMUNITY): Payer: BC Managed Care – PPO | Admitting: Anesthesiology

## 2012-03-17 ENCOUNTER — Encounter (HOSPITAL_COMMUNITY)
Admission: EM | Disposition: A | Discharge status: Home / Self Care | Payer: Self-pay | Source: Home / Self Care | Attending: Emergency Medicine

## 2012-03-17 ENCOUNTER — Encounter (HOSPITAL_COMMUNITY): Payer: Self-pay | Admitting: Anesthesiology

## 2012-03-17 DIAGNOSIS — Z96649 Presence of unspecified artificial hip joint: Secondary | ICD-10-CM | POA: Insufficient documentation

## 2012-03-17 DIAGNOSIS — T84029A Dislocation of unspecified internal joint prosthesis, initial encounter: Secondary | ICD-10-CM

## 2012-03-17 DIAGNOSIS — I2510 Atherosclerotic heart disease of native coronary artery without angina pectoris: Secondary | ICD-10-CM | POA: Insufficient documentation

## 2012-03-17 DIAGNOSIS — X500XXA Overexertion from strenuous movement or load, initial encounter: Secondary | ICD-10-CM | POA: Insufficient documentation

## 2012-03-17 HISTORY — PX: HIP CLOSED REDUCTION: SHX983

## 2012-03-17 SURGERY — CLOSED MANIPULATION, JOINT, HIP
Anesthesia: General | Laterality: Left | Wound class: Clean

## 2012-03-17 MED ORDER — SODIUM CHLORIDE 0.9 % IV BOLUS (SEPSIS)
1000.0000 mL | Freq: Once | INTRAVENOUS | Status: AC
  Administered 2012-03-17: 1000 mL via INTRAVENOUS

## 2012-03-17 MED ORDER — ONE-DAILY MULTI VITAMINS PO TABS: 1.0000 | ORAL_TABLET | Freq: Every day | ORAL | Status: DC

## 2012-03-17 MED ORDER — METOCLOPRAMIDE HCL 5 MG/ML IJ SOLN: 5.0000 mg | Freq: Three times a day (TID) | INTRAMUSCULAR | Status: DC | PRN

## 2012-03-17 MED ORDER — ONDANSETRON HCL 4 MG/2ML IJ SOLN: 4.0000 mg | Freq: Four times a day (QID) | INTRAMUSCULAR | Status: DC | PRN

## 2012-03-17 MED ORDER — FENTANYL CITRATE 0.05 MG/ML IJ SOLN
INTRAMUSCULAR | Status: AC
  Administered 2012-03-17: 100 ug via INTRAVENOUS
  Filled 2012-03-17: qty 2

## 2012-03-17 MED ORDER — FENTANYL CITRATE 0.05 MG/ML IJ SOLN
100.0000 ug | INTRAMUSCULAR | Status: AC | PRN
  Administered 2012-03-17: 50 ug via INTRAVENOUS
  Administered 2012-03-17: 100 ug via INTRAVENOUS
  Administered 2012-03-17: 50 ug via INTRAVENOUS
  Filled 2012-03-17 (×2): qty 2

## 2012-03-17 MED ORDER — PROMETHAZINE HCL 25 MG/ML IJ SOLN: 6.2500 mg | INTRAMUSCULAR | Status: DC | PRN

## 2012-03-17 MED ORDER — FENTANYL CITRATE 0.05 MG/ML IJ SOLN: 100.0000 ug | Freq: Once | INTRAMUSCULAR | Status: DC

## 2012-03-17 MED ORDER — LACTATED RINGERS IV SOLN: INTRAVENOUS | Status: DC

## 2012-03-17 MED ORDER — SUCCINYLCHOLINE CHLORIDE 20 MG/ML IJ SOLN
INTRAMUSCULAR | Status: DC | PRN
  Administered 2012-03-17: 100 mg via INTRAVENOUS

## 2012-03-17 MED ORDER — GLUCOSAMINE-CHONDROITIN 500-400 MG PO TABS: 1.0000 | ORAL_TABLET | Freq: Three times a day (TID) | ORAL | Status: DC

## 2012-03-17 MED ORDER — PROPOFOL 10 MG/ML IV EMUL: 5.0000 ug/kg/min | Freq: Once | INTRAVENOUS | Status: DC

## 2012-03-17 MED ORDER — KCL IN DEXTROSE-NACL 20-5-0.45 MEQ/L-%-% IV SOLN: INTRAVENOUS | Status: DC

## 2012-03-17 MED ORDER — PROPOFOL 10 MG/ML IV EMUL
INTRAVENOUS | Status: DC | PRN
  Administered 2012-03-17: 150 mg via INTRAVENOUS

## 2012-03-17 MED ORDER — SODIUM CHLORIDE 0.9 % IV SOLN
INTRAVENOUS | Status: DC
  Administered 2012-03-17: 15:00:00 via INTRAVENOUS
  Administered 2012-03-17: 1000 mL via INTRAVENOUS

## 2012-03-17 MED ORDER — ADULT MULTIVITAMIN W/MINERALS CH
1.0000 | ORAL_TABLET | Freq: Every day | ORAL | Status: DC
  Filled 2012-03-17: qty 1

## 2012-03-17 MED ORDER — FENTANYL CITRATE 0.05 MG/ML IJ SOLN: 25.0000 ug | INTRAMUSCULAR | Status: DC | PRN

## 2012-03-17 MED ORDER — METOCLOPRAMIDE HCL 5 MG PO TABS
5.0000 mg | ORAL_TABLET | Freq: Three times a day (TID) | ORAL | Status: DC | PRN
  Filled 2012-03-17: qty 2

## 2012-03-17 MED ORDER — PROPOFOL 10 MG/ML IV BOLUS
INTRAVENOUS | Status: AC | PRN
  Administered 2012-03-17: 200 mg via INTRAVENOUS

## 2012-03-17 MED ORDER — CALCIUM CARBONATE-VITAMIN D 500-200 MG-UNIT PO TABS
1.0000 | ORAL_TABLET | Freq: Two times a day (BID) | ORAL | Status: DC
  Filled 2012-03-17 (×3): qty 1

## 2012-03-17 MED ORDER — ONDANSETRON HCL 4 MG PO TABS
4.0000 mg | ORAL_TABLET | Freq: Four times a day (QID) | ORAL | Status: DC | PRN
  Filled 2012-03-17: qty 1

## 2012-03-17 MED ORDER — PROPOFOL 10 MG/ML IV EMUL
INTRAVENOUS | Status: DC
Start: 2012-03-17 — End: 2012-03-17
  Filled 2012-03-17: qty 20

## 2012-03-17 MED ORDER — HYDROCODONE-ACETAMINOPHEN 5-325 MG PO TABS: 1.0000 | ORAL_TABLET | ORAL | Status: DC | PRN

## 2012-03-17 SURGICAL SUPPLY — 2 items
IMMOBILIZER KNEE 20 (SOFTGOODS) ×2
IMMOBILIZER KNEE 20 THIGH 36 (SOFTGOODS) ×1 IMPLANT

## 2012-03-17 NOTE — Progress Notes (Signed)
Pt  Up with assist to restroom twice. Tolerated well. VSS. Wants to go home. Leg immobilizer on left leg and ice pack in place. Good CMS to left foot. Verbalizes understanding of d/c instructions. Home with husband at 66.

## 2012-03-17 NOTE — ED Notes (Signed)
[id number]<AV> Expected date:03/17/12<BR> Expected time: 1:48 PM<BR> Means of arrival:Ambulance<BR> Comments:<BR> EMS 32 Gc- 71 y/o female fall at [facility]. Hip pain/ deformity. No blood thinners.

## 2012-03-17 NOTE — H&P (Addendum)
Danielle Carson is an 71 y.o. female.   Chief Complaint: Left hip pain with past history of dislocations. HPI: Status post revision left total of arthroplasty in December 2011 with a dislocation while hiking last year. Was seated in a chair today when she then over and leaned to the left side getting both arms outside of the left lower extremity as she leaned forward. She immediately felt the hip dislocate with the onset of pain. EMS was called she was transported was a long hospital. She denies any other injuries. At the time of the dislocation she thinks she was flexed for about 120-130 with adduction and internal rotation the left lower extremity. She denies any other injuries.  Past Medical History  Diagnosis Date  . Coronary artery disease   . Hiatal hernia     Past Surgical History  Procedure Date  . Joint replacement     bilat hip replacements and left hip revision    Family History  Problem Relation Age of Onset  . Cardiomyopathy Mother   . Osteoarthritis Mother   . Cardiomyopathy Sister   . Osteoarthritis Sister    Social History:  reports that she has never smoked. She does not have any smokeless tobacco history on file. She reports that she drinks about 3.6 ounces of alcohol per week. She reports that she does not use illicit drugs.  Allergies:  Allergies  Allergen Reactions  . Aspirin     On very high doses, very old allergy    Medications Prior to Admission  Medication Dose Route Frequency Provider Last Rate Last Dose  . 0.9 %  sodium chloride infusion   Intravenous Continuous Laray Anger, DO      . fentaNYL (SUBLIMAZE) 0.05 MG/ML injection        100 mcg at 03/17/12 1602  . fentaNYL (SUBLIMAZE) injection 100 mcg  100 mcg Intravenous Q30 min PRN Laray Anger, DO   50 mcg at 03/17/12 1519  . fentaNYL (SUBLIMAZE) injection 100 mcg  100 mcg Intravenous Once Laray Anger, DO      . propofol (DIPRIVAN) 10 MG/ML infusion           . sodium  chloride 0.9 % bolus 1,000 mL  1,000 mL Intravenous Once Laray Anger, DO       Medications Prior to Admission  Medication Sig Dispense Refill  . calcium-vitamin D (OSCAL WITH D) 500-200 MG-UNIT per tablet Take 1 tablet by mouth 2 (two) times daily.        Marland Kitchen glucosamine-chondroitin 500-400 MG tablet Take 1 tablet by mouth 3 (three) times daily. OTC       . Multiple Vitamin (MULTIVITAMIN) tablet Take 1 tablet by mouth daily.          No results found for this or any previous visit (from the past 48 hour(s)). 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  PORTABLE LEFT HIP - 1 VIEW  Comparison: 11/01/2011.  Findings: Recurrent left posterior hip arthroplasty dislocation. No fracture is identified.  Acetabular cup appears unchanged in orientation and position. Single view is submitted for interpretation.  IMPRESSION: Recurrent left posterior hip dislocation.  Original Report Authenticated By: Andreas Newport, M.D.    Review of Systems  Constitutional: Negative.   HENT: Negative.   Eyes: Negative.   Cardiovascular: Negative.   Gastrointestinal: Negative.   Genitourinary: Negative.   Musculoskeletal: Negative.   Skin:  Negative.   Neurological: Negative.   Endo/Heme/Allergies: Negative.   Psychiatric/Behavioral: Negative.     Blood pressure 139/72, pulse 75, resp. rate 16, height 5' 8.5" (1.74 m), weight 71.215 kg (157 lb), SpO2 100.00%. Physical Exam  Constitutional: She is oriented to person, place, and time. She appears well-developed and well-nourished.  HENT:  Head: Normocephalic and atraumatic.  Eyes: Pupils are equal, round, and reactive to light.  Neck: Normal range of motion. Neck supple.  Cardiovascular: Normal rate.   Respiratory: Effort normal.  GI: Soft.  Neurological: She is alert and oriented to person, place, and time.  Skin: Skin is warm and dry.  Psychiatric:  She has a normal mood and affect. Her behavior is normal. Judgment and thought content normal.     Assessment/Plan Dislocated left total hip secondary to extreme forward flexion with internal rotation and adduction.  Plan: After obtaining informed written consent the patient was administered propofol and fentanyl IV sedation and attempted close reduction was made. Because of muscle spasm reduction cannot be accomplished, the patient was partially wake throughout the procedure. Therefore she'll be taken to the operating room there was a long hospital given a general anesthetic and a repeat attempt at closed reduction. Risks and benefits of surgery been discussed at length with the patient.  [name] J 03/17/2012, 4:18 PM

## 2012-03-17 NOTE — ED Notes (Signed)
Jewelry taken off put in container and given to husband.

## 2012-03-17 NOTE — Anesthesia Preprocedure Evaluation (Signed)
Anesthesia Evaluation  Patient identified by MRN, date of birth, ID band Patient awake    Reviewed: Allergy & Precautions, H&P , NPO status , Patient's Chart, lab work & pertinent test results  History of Anesthesia Complications (+) PONV  Airway Mallampati: II TM Distance: >3 FB Neck ROM: Full    Dental  (+) Caps, Teeth Intact and Dental Advisory Given,    Pulmonary neg pulmonary ROS,  breath sounds clear to auscultation  Pulmonary exam normal       Cardiovascular negative cardio ROS  Rhythm:Regular Rate:Normal     Neuro/Psych  Neuromuscular disease negative neurological ROS  negative psych ROS   GI/Hepatic Neg liver ROS, hiatal hernia,   Endo/Other  negative endocrine ROS  Renal/GU negative Renal ROS  negative genitourinary   Musculoskeletal negative musculoskeletal ROS (+)   Abdominal   Peds negative pediatric ROS (+)  Hematology negative hematology ROS (+)   Anesthesia Other Findings   Reproductive/Obstetrics negative OB ROS                           Anesthesia Physical Anesthesia Plan  ASA: II  Anesthesia Plan: General   Post-op Pain Management:    Induction: Intravenous  Airway Management Planned: Oral ETT  Additional Equipment:   Intra-op Plan:   Post-operative Plan: Extubation in OR  Informed Consent: I have reviewed the patients History and Physical, chart, labs and discussed the procedure including the risks, benefits and alternatives for the proposed anesthesia with the patient or authorized representative who has indicated his/her understanding and acceptance.   Dental advisory given  Plan Discussed with: CRNA  Anesthesia Plan Comments:         Anesthesia Quick Evaluation

## 2012-03-17 NOTE — ED Notes (Signed)
Pt was in sitting position, bent forward, felt left hip pop. Has had rt hip dislocation in past.

## 2012-03-17 NOTE — Anesthesia Postprocedure Evaluation (Signed)
Anesthesia Post Note  Patient: Danielle Carson  Procedure(s) Performed: Procedure(s) (LRB): CLOSED MANIPULATION HIP (Left)  Anesthesia type: General  Patient location: PACU  Post pain: Pain level controlled  Post assessment: Post-op Vital signs reviewed  Last Vitals:  Filed Vitals:   03/17/12 1745  BP: 145/75  Pulse: 84  Temp:   Resp: 21    Post vital signs: Reviewed  Level of consciousness: sedated  Complications: No apparent anesthesia complications

## 2012-03-17 NOTE — Op Note (Signed)
Preoperative diagnosis: Dislocated left total hip  Postoperative diagnosis: Same  Procedure: Closed reduction left total hip  Surgeon Latrel Szymczak J. [name] M.D.  Anesthetic Gen. endotracheal  Estimated blood loss minimal  Fluid replacement 500 cc crystalloid  Indications for procedure: Status post revision left total hip arthroplasty a year and half ago with one dislocation when she fell hiking last year she was seated in a chair today and backboard and then rotated to the left bringing both arms and shoulder outside of the left lower extremity as she bent forward she felt it dislocate and was transported to [facility]. Close reduction was attempted in the emergency room but did not succeed. After discussing risks and benefits close reduction under general anesthesia will be attempted appear  Description of procedure: Patient was identified by arm band and taken to operating room 3 a [facility] where the appropriate site monitors were attached and general endotracheal anesthesia was induced with the patient supine in a hospital gurney. At this point pressure was applied to the anterior iliac spines of the left hip and in traction with internal and external rotation was accomplished with a satisfying reduction of the total hip arthroplasty. Postoperative x-rays were taken confirming the reduction. The patient was awakened extubated and taken to the recovery without difficulty.

## 2012-03-17 NOTE — Transfer of Care (Signed)
Immediate Anesthesia Transfer of Care Note  Patient: Danielle Carson  Procedure(s) Performed: Procedure(s) (LRB): CLOSED MANIPULATION HIP (Left)  Patient Location: PACU  Anesthesia Type: General  Level of Consciousness: awake, alert  and patient cooperative  Airway & Oxygen Therapy: Patient Spontanous Breathing and Patient connected to face mask oxygen  Post-op Assessment: Report given to PACU RN and Post -op Vital signs reviewed and stable  Post vital signs: Reviewed and stable  Complications: No apparent anesthesia complications

## 2012-03-17 NOTE — ED Provider Notes (Signed)
History     CSN: [id number]  Arrival date & time 03/17/12  1359   First MD Initiated Contact with Patient 03/17/12 1402      Chief Complaint  Patient presents with  . Hip Pain    left hip    HPI Pt was seen at 1400.   Per pt, c/o sudden onset and persistence of constant left hip pain that began PTA.  Pt describes the pain as "like the last time I dislocated my hip" in 10/2011.  Pt states she was sitting in a chair at work when she bent forward to reach the printer on the floor and felt a "pop" in her left hip area.  Pt denies any other complaints.  Denies direct injury, no tingling/numbness in extremities, no focal motor weakness.   Past Medical History  Diagnosis Date  . Coronary artery disease   . Hiatal hernia     Past Surgical History  Procedure Date  . Joint replacement     bilat hip replacements and left hip revision    Family History  Problem Relation Age of Onset  . Cardiomyopathy Mother   . Osteoarthritis Mother   . Cardiomyopathy Sister   . Osteoarthritis Sister     History  Substance Use Topics  . Smoking status: Never Smoker   . Smokeless tobacco: Not on file  . Alcohol Use: 3.6 oz/week    6 Glasses of wine per week      Review of Systems ROS: Statement: All systems negative except as marked or noted in the HPI; Constitutional: Negative for fever and chills. ; ; Eyes: Negative for eye pain, redness and discharge. ; ; ENMT: Negative for ear pain, hoarseness, nasal congestion, sinus pressure and sore throat. ; ; Cardiovascular: Negative for chest pain, palpitations, diaphoresis, dyspnea and peripheral edema. ; ; Respiratory: Negative for cough, wheezing and stridor. ; ; Gastrointestinal: Negative for nausea, vomiting, diarrhea, abdominal pain, blood in stool, hematemesis, jaundice and rectal bleeding. . ; ; Genitourinary: Negative for dysuria, flank pain and hematuria. ; ; Musculoskeletal: +left hip pain.  Negative for back pain and neck pain. Negative for  swelling and trauma.; ; Skin: Negative for pruritus, rash, abrasions, blisters, bruising and skin lesion.; ; Neuro: Negative for headache, lightheadedness and neck stiffness. Negative for weakness, altered level of consciousness , altered mental status, extremity weakness, paresthesias, involuntary movement, seizure and syncope.      Allergies  Aspirin  Home Medications   Current Outpatient Rx  Name Route Sig Dispense Refill  . CALCIUM CARBONATE-VITAMIN D 500-200 MG-UNIT PO TABS Oral Take 1 tablet by mouth 2 (two) times daily.      Marland Kitchen GLUCOSAMINE-CHONDROITIN 500-400 MG PO TABS Oral Take 1 tablet by mouth 3 (three) times daily. OTC     . ONE-DAILY MULTI VITAMINS PO TABS Oral Take 1 tablet by mouth daily.      Marland Kitchen VITAMIN B-1 50 MG PO TABS Oral Take 50 mg by mouth daily. OTC       BP 131/65  Pulse 72  Resp 14  Ht 5' 8.5" (1.74 m)  Wt 157 lb (71.215 kg)  BMI 23.52 kg/m2  SpO2 97%  Physical Exam 1405: Physical examination:  Nursing notes reviewed; Vital signs and O2 SAT reviewed;  Constitutional: Well developed, Well nourished, Well hydrated, In no acute distress; Head:  Normocephalic, atraumatic; Eyes: EOMI, PERRL, No scleral icterus; ENMT: Mouth and pharynx normal, Mucous membranes moist; Neck: Supple, Full range of motion, No lymphadenopathy; Cardiovascular: Regular rate  and rhythm, No murmur, rub, or gallop; Respiratory: Breath sounds clear & equal bilaterally, No rales, rhonchi, wheezes, or rub, Normal respiratory effort/excursion; Chest: Nontender, Movement normal; Abdomen: Soft, Nontender, Nondistended, Normal bowel sounds; Extremities: Pulses normal, +left hip tenderness to palp with LLE shortened and internally rotated.  NT left knee/ankle/foot, strong pedal pulse, No edema, No calf edema or asymmetry.; Neuro: AA&Ox3, Major CN grossly intact.  No gross focal motor or sensory deficits in extremities.; Skin: Color normal, Warm, Dry, no rash.     ED Course  Procedures   Procedural  sedation:  Presedation checklist: Informed consent, including risks and benefits, obtained. Pre-procedural timeout performed with Pre-sedation confirmation of laterality/correct procedure site.  Provider confirms review of the nurses' note, allergies, medications, pertinent labs, PMH, pre-induction vital signs, pulse oximetry, pain level, and patient condition satisfactory for commencing with order for sedation/procedure.  Awake and alert, Patient on monitor, Continuous pulse oximetry, Supplemental oxygen provided, NPO status verified, Suction available, Sedation reversal agent(s) at bedside as applicable; Resuscitative equipment readily available. Last meal: 5 hours ago;  Physician time: 15 minutes; ASA Classification: 1. Normal healthy patient.;  Agent: Fentanyl , Propofol 200mg ; Level: Moderate;  Complications: None; Treatment: None;  Reassessment: Awake, Alert, Oriented, Vital signs normal, Returned to presedation baseline.    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  PORTABLE LEFT HIP - 1 VIEW  Comparison: 11/01/2011.  Findings: Recurrent left posterior hip arthroplasty dislocation. No fracture is identified.  Acetabular cup appears unchanged in orientation and position. Single view is submitted for interpretation.  IMPRESSION: Recurrent left posterior hip dislocation.  Original Report Authenticated By: Andreas Newport, M.D.      1515:  T/C to Ortho Dr. [name], case discussed, including:  HPI, pertinent PM/SHx, VS/PE, dx testing:  Requests to have procedural sedation ready, he will come to ED for reduction.  1600:  Ortho MD unable to reduce left hip at bedside.  Pt remains lethargic, resps easy, VSS.  He will take to OR for reduction.     Laray Anger, DO 03/17/12 2017

## 2012-03-24 ENCOUNTER — Other Ambulatory Visit: Payer: Self-pay | Admitting: *Deleted

## 2012-03-24 LAB — T3, FREE: T3, Free: 2.5 pg/mL (ref 2.3–4.2)

## 2012-03-25 LAB — VITAMIN D 25 HYDROXY (VIT D DEFICIENCY, FRACTURES): Vit D, 25-Hydroxy: 56 ng/mL (ref 30–89)

## 2012-03-25 LAB — PTH, INTACT AND CALCIUM
Calcium, Total (PTH): 9.8 mg/dL (ref 8.4–10.5)
PTH: 15.3 pg/mL (ref 14.0–72.0)

## 2012-03-27 LAB — VITAMIN D 1,25 DIHYDROXY: Vitamin D 1, 25 (OH)2 Total: 40 pg/mL (ref 18–72)

## 2012-03-28 ENCOUNTER — Ambulatory Visit: Payer: BC Managed Care – PPO | Admitting: "Endocrinology

## 2012-03-29 ENCOUNTER — Ambulatory Visit (INDEPENDENT_AMBULATORY_CARE_PROVIDER_SITE_OTHER): Payer: BC Managed Care – PPO | Admitting: "Endocrinology

## 2012-03-29 ENCOUNTER — Encounter: Payer: Self-pay | Admitting: "Endocrinology

## 2012-03-29 DIAGNOSIS — M810 Age-related osteoporosis without current pathological fracture: Secondary | ICD-10-CM

## 2012-03-29 DIAGNOSIS — E049 Nontoxic goiter, unspecified: Secondary | ICD-10-CM

## 2012-03-29 DIAGNOSIS — E211 Secondary hyperparathyroidism, not elsewhere classified: Secondary | ICD-10-CM

## 2012-03-29 DIAGNOSIS — E559 Vitamin D deficiency, unspecified: Secondary | ICD-10-CM

## 2012-03-29 NOTE — Patient Instructions (Signed)
Followup visit in 6 months. Please have lab tests in one to 2 weeks prior to next visit. Please try to increase calcium from food and dairy.

## 2012-03-29 NOTE — Progress Notes (Signed)
Chief complaint: Follow-up osteoporosis, secondary hyperparathyroidism, hypocalcemia, hyperlipidemia, and goiter  History of present illness: The patient is a 71 year old Caucasian woman. 1. The patient was referred to me on 8/09/ 06 for evaluation of osteoporosis by her primary care provider Dr. [name] M.D.   A. She developed bilateral hip joint pains in the late 1990s. In 1996, Dr. [name] of [facility], ordered a bone mineral density study. That study showed significant osteoporosis in the spine and in both hips.  The patient was treated with calcium carbonate and with a multivitamin that included vitamin D. Despite taking these medications and having a reasonably robust exercise regimen, the patient's osteoporosis worsened. In 1999 Dr. [name]  performed bilateral hip replacements. Treatment with Fosamax was begun at about that time. In the intervening years bone mineral density had not changed significantly. She was treated with Boniva for several months, but just forgot to take it. Her past medical history revealed that she had had arthritis in many joints for many years. She had undergone natural menopause about 20 years prior. She did take estrogen for one or two years. Her surgeries were bilateral hip or placements and bilateral vein stripping. 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 Family history revealed that 5 of her 6 siblings had osteoporosis. Two of those had celiac disease when younger. A sister and brother had thyroid problems as well.  B. Her physical examination was completely unremarkable. Laboratory data revealed that her PTH was 75.5, which was at the upper limit of normal. Her calcium was 8.7, which was in the lower half. Her 25 hydroxy vitamin D was 39 which was normal. Her 125 vitamin D was 51 which was also normal. I felt that she had relative hyperparathyroidism due to relatively low calcium and vitamin D intake. I  asked her to increase her calcium and vitamin D intake. On subsequent testing on 09/30/2005, her PTH was 37.0, calcium 9.3, 25-vitamin D 47, and 1,25-vitamin D 55.  2. In the interim the patient has done well overall. Because it was unclear how long she should remain on Fosamax treatment, I recommended that she obtain a second opinion from Dr. Gwendolyn Grant at [facility]. She saw Dr. Valarie Cones in June of 2009. He recommended that she not be on Fosamax or Boniva treatment. He felt that she had derived all of the benefit from Fosamax that she could. He recommended that she continue her calcium, vitamin D, and exercise. The patient's second to last visit here was on 04/08/2010. After that visit she had a left hip revision procedure performed. She continued to have follow-up visits with Dr. [name] in orthopedics. A bone mineral density study performed on 02/16/2011 showed that her bone mineral density probably declined about 2.4-3.7% in the last 3 years.  3.The patient's last [facility] visit was on 04/29/11. In the interim she had two dislocations of the left hip, once in November and once two weeks ago. Each time Dr. [name] had to re-set the hip under anesthesia. Otherwise she has been pretty healthy and has been doing well. 4. Pertinent Review of Systems: Constitutional: The patient feels well, is healthy, and has no significant complaints. Eyes: Vision is good with her glasses. There are no significant eye complaints. Neck: The patient has no complaints of anterior neck swelling, soreness, tenderness,  pressure, discomfort, or difficulty swallowing. She continues to have pains in her posterior neck. Heart: Heart rate increases  with exercise or other physical activity. The patient has no complaints of palpitations, irregular heat beats, chest pain, or chest pressure. Gastrointestinal: She does have reflux when she eats something spicy. Bowel movents seem normal. The patient has no  complaints of excessive hunger, acid reflux, upset stomach, stomach aches or pains, diarrhea, or constipation. Legs: Muscle mass and strength seem normal. There are no complaints of numbness, tingling, or burning. She has occasional leg muscle pains after walking. She has occasional edema if she sits too long.  Feet: There are no obvious foot problems. There are no complaints of numbness, tingling, burning, or pain. No edema is noted.  PAST MEDICAL, FAMILY, AND SOCIAL HISTORY: 1. Work and family: Her son is a Midwife in the [employer] who is stationed in [employer] account planner with [employer]. She will be traveling back to [place] within the next year. All of her siblings and most of her nieces and nephews are hypothyroid. 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 She did achieve her PhD. 2. Activities: She remains physically active. She walks a lot. 3. Primary care provider: Dr.Eric Wilburn [facility]  REVIEW OF SYSTEMS: There are no other significant problems involving her other body systems.  PHYSICAL EXAM: BP 142/75  Pulse 66  Wt 158 lb 11.2 oz (71.986 kg) Constitutional: The patient looks healthy and appears physically and emotionally well. The patient is alert, bright, and very mentally sharp. Eyes: There is no arcus or proptosis.  Mouth: The oropharynx appears normal. The tongue appears normal. There is normal oral moisture. There is no obvious gingivitis. Neck: There are no bruits present. The thyroid gland appears normal in size. The thyroid gland is 18-20 grams in size. The consistency of the thyroid gland is normal. There is no thyroid tenderness to palpation. Lungs: The lungs are clear. Air movement is good. Heart: The heart rhythm and rate appear normal. Heart sounds S1 and S2 are normal. I do not appreciate any pathologic heart murmurs. Abdomen: The abdominal size is normal. Bowel sounds are normal. The abdomen is soft and non-tender. There is no obviously palpable  hepatomegaly, splenomegaly, or other masses.  Arms: Muscle mass appears appropriate for age.  Hands: There is no obvious tremor. Phalangeal and metacarpophalangeal joints appear normal. Palms are normal. Legs: Muscle mass appears appropriate for age. There is no edema.  Neurologic: Muscle strength is normal for age and gender  in both the upper and the lower extremities. Muscle tone appears normal. Sensation to touch is normal in the legs.   Labs: 03/24/12 - PTH 15.3, calcium 9.8, 25-hydroxy vitamin D 56, 1.25-dihydroxy vitamin D 40, TSH 1.254, free T4 1.29, free T3 2.5.   BMD: The patient's bone mineral density study performed on 02/16/2011 show that she had lost approximately 2.4-3.7% of mineral density in the past year.  ASSESSMENT: 1. Osteoporosis: The patient osteoporosis was slightly worse. However, based on recommendations of her bone mineral density specialist, Dr. Valarie Cones, we had not resumed treatment with bisphosphonates. The patient would like to wait until after we do her next BMD before checking to see if she should have a return visit with Dr. Valarie Cones. 2. Vitamin D deficiency: Vitamin D levels are currently quite good. 3. Secondary hyperparathyroidism: Her parathyroid hormone levels are now well-controlled. 4. Goiter: Thyroid size is stable. She remains euthyroid. 5. Hypocalcemia: The patient's serum calcium remains in the third quartile. I would still like to see it somewhat higher over time to provide her adequate amounts of calcium  to foster better calcium uptake by bone.  PLAN: 1. Diagnostic: Will repeat her laboratory tests in 6 months. Will repeat her BMD in the Spring of 2014.  2. Therapeutic: After we see the results of her next BMD in 2014, we'll see if we need to contact Dr. Valarie Cones to obtain a follow-up appointment. 3. Patient education: We discussed the current recommendations from the bone mineral density specialist as to calcium and vitamin D intake. I've asked her to try to  increase the amount of calcium which she obtains from natural sources. 4. Follow-up: The patient will have a follow-up appointment in 6 months.  Level of Service: This visit lasted in excess of 40 minutes. More than 50% of the visit was devoted to counseling.  David Stall

## 2012-03-30 ENCOUNTER — Encounter (HOSPITAL_COMMUNITY): Payer: Self-pay | Admitting: Orthopedic Surgery

## 2012-09-20 ENCOUNTER — Other Ambulatory Visit: Payer: Self-pay | Admitting: *Deleted

## 2012-09-20 DIAGNOSIS — E038 Other specified hypothyroidism: Secondary | ICD-10-CM

## 2012-09-23 LAB — T3, FREE: T3, Free: 2.6 pg/mL (ref 2.3–4.2)

## 2012-09-24 LAB — VITAMIN D 25 HYDROXY (VIT D DEFICIENCY, FRACTURES): Vit D, 25-Hydroxy: 49 ng/mL (ref 30–89)

## 2012-09-26 LAB — PTH, INTACT AND CALCIUM
Calcium, Total (PTH): 9.4 mg/dL (ref 8.4–10.5)
PTH: 34.6 pg/mL (ref 14.0–72.0)

## 2012-09-27 LAB — VITAMIN D 1,25 DIHYDROXY
Vitamin D 1, 25 (OH)2 Total: 53 pg/mL (ref 18–72)
Vitamin D3 1, 25 (OH)2: 53 pg/mL

## 2012-10-03 ENCOUNTER — Encounter: Payer: Self-pay | Admitting: "Endocrinology

## 2012-10-03 ENCOUNTER — Ambulatory Visit (INDEPENDENT_AMBULATORY_CARE_PROVIDER_SITE_OTHER): Payer: BC Managed Care – PPO | Admitting: "Endocrinology

## 2012-10-03 VITALS — BP 151/76 | HR 64 | Wt 159.5 lb

## 2012-10-03 DIAGNOSIS — R600 Localized edema: Secondary | ICD-10-CM

## 2012-10-03 DIAGNOSIS — E559 Vitamin D deficiency, unspecified: Secondary | ICD-10-CM | POA: Insufficient documentation

## 2012-10-03 DIAGNOSIS — E211 Secondary hyperparathyroidism, not elsewhere classified: Secondary | ICD-10-CM

## 2012-10-03 DIAGNOSIS — R609 Edema, unspecified: Secondary | ICD-10-CM | POA: Insufficient documentation

## 2012-10-03 DIAGNOSIS — I10 Essential (primary) hypertension: Secondary | ICD-10-CM

## 2012-10-03 DIAGNOSIS — M810 Age-related osteoporosis without current pathological fracture: Secondary | ICD-10-CM

## 2012-10-03 DIAGNOSIS — E049 Nontoxic goiter, unspecified: Secondary | ICD-10-CM

## 2012-10-03 MED ORDER — LISINOPRIL 2.5 MG PO TABS: 2.5000 mg | ORAL_TABLET | Freq: Every day | ORAL | Status: DC

## 2012-10-03 NOTE — Patient Instructions (Addendum)
Follow up visit in 6 months. Please return in 2 weeks for a BP check. Please schedule BMD at [facility] in about March 2014.

## 2012-10-03 NOTE — Progress Notes (Signed)
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 She was unaccompanied for today's visit.  1. The patient was referred to me on 07/15/2005 for evaluation of osteoporosis by her former primary care provider, Dr. [name], M.D.   A. The patient developed bilateral hip joint pains in the late 1990s. In 1996, Dr. [name] of [facility], ordered a bone mineral density study. That study showed significant osteoporosis in the spine and in both hips.  The patient was treated with calcium carbonate and with a multivitamin that included vitamin D. Despite taking these medications and having a reasonably robust exercise regimen, the patient's osteoporosis worsened. In 1999 Dr. [name]  performed bilateral hip replacements. Treatment with Fosamax was begun at about that time. In the intervening years bone mineral density had not changed significantly. She was treated with Boniva for several months, but just forgot to take it. Her past medical history revealed that she had had arthritis in many joints for many years. She had undergone natural menopause about 20 years prior. She did take estrogen for one or two years, then stopped that medication. Her surgeries included bilateral hip replacements and bilateral vein stripping. 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 Family history revealed that 5 of her 6 siblings had osteoporosis. Two of those had celiac disease when younger. A sister and brother had thyroid problems as well.    B. Her physical examination was completely unremarkable. Laboratory data revealed that her PTH was 75.5, which was at the upper limit of normal. Her calcium was 8.7, which was in the lower half. Her 25 hydroxy vitamin D was 39 which was normal. Her 1,25 vitamin D was 51 which was also  normal. I felt that she had relative hyperparathyroidism due to relatively low calcium and vitamin D intake. I asked her to increase her calcium and vitamin D intake. On subsequent testing on 09/30/2005, her PTH was 37.0, calcium 9.3, 25-vitamin D 47, and 1,25-vitamin D 55.  2. In the interim the patient has done well overall. Because it was unclear how long she should remain on Fosamax treatment, I recommended that she obtain a second opinion from Dr. Gwendolyn Grant at [facility]. She saw Dr. Valarie Cones in June of 2009. He recommended that she not take either Fosamax or Boniva treatments. He felt that she had derived all of the benefit from Fosamax that she could. He recommended that she continue her calcium, vitamin D, and exercise. Since then she has had a left hip revision procedure performed.  She continued to have follow-up visits with Dr. [name] in orthopedics. A bone mineral density study performed on 02/16/2011 showed that her bone mineral density probably declined about 2.4-3.7% in the last 3 years. Since that study she has also had two hip dislocation procedures performed. 3.The patient's last [facility] visit was on 03/29/12. In the interim she has not had any more dislocations of the left hip. She notes frequent swelling of her ankles after sitting at the computer for hours at a time. She does not think that she is taking in too much salt, except in crackers and salty peanuts.Otherwise she has been pretty healthy and has been doing well. 4. Pertinent Review of Systems: Constitutional: The patient feels well, is healthy, and has no significant complaints. Eyes: Vision is good with her glasses. There are no  significant eye complaints. Neck: The patient has no complaints of anterior neck swelling, soreness, tenderness,  pressure, discomfort, or difficulty swallowing. She continues to have pains in her posterior neck and shoulder girdle. Heart: Heart rate increases with  exercise or other physical activity. The patient has no complaints of palpitations, irregular heat beats, chest pain, or chest pressure. Gastrointestinal: Bowel movents seem normal. The patient has no complaints of excessive hunger, acid reflux, upset stomach, stomach aches or pains, diarrhea, or constipation. Legs: Muscle mass and strength seem normal. There are no complaints of numbness, tingling, or burning. She has occasional leg muscle pains after walking. She has more frequent edema if she sits too long.  Feet: There are no obvious foot problems. There are no complaints of numbness, tingling, burning, or pain. No edema is noted.  PAST MEDICAL, FAMILY, AND SOCIAL HISTORY: 1. Work and family: Her son is a Midwife in the [employer] who is stationed in [place], [place] with the [employer]. She will be traveling back to [place] for [place]. All of her siblings and most of her nieces and nephews are hypothyroid. 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  2. Activities: She is not as physically active as she was.  3. Primary care provider: Dr.Eric Dory Peru retired. She now sees the PA, Arnette Felts, at [facility]  REVIEW OF SYSTEMS: There are no other significant problems involving her other body systems.  PHYSICAL EXAM: BP 151/76  Pulse 64  Wt 159 lb 8 oz (72.349 kg) Repeat BP in right arm after 30 minutes of sitting was 146/83. Constitutional: The patient looks healthy and appears physically and emotionally well. The patient is alert, bright, and very mentally sharp. Eyes: There is no arcus or proptosis.  Mouth: The oropharynx appears normal. The tongue appears normal. There is normal oral moisture. There is no obvious gingivitis. Neck: There are no bruits present. The thyroid gland appears normal in size. The thyroid gland is 18-20 grams in size. The consistency of the thyroid gland is normal. There is no thyroid tenderness to palpation. Lungs: The lungs are clear. Air  movement is good. Heart: The heart rhythm and rate appear normal. Heart sounds S1 and S2 are normal. I do not appreciate any pathologic heart murmurs. Abdomen: The abdominal size is normal. Bowel sounds are normal. The abdomen is soft and non-tender. There is no obviously palpable hepatomegaly, splenomegaly, or other masses.  Arms: Muscle mass appears appropriate for age.  Hands: There is no obvious tremor. Phalangeal and metacarpophalangeal joints appear normal. Palms are normal. Legs: Muscle mass appears appropriate for age. There is trace to 1+ edema bilaterally.  Neurologic: Muscle strength is normal for age and gender  in both the upper and the lower extremities. Muscle tone appears normal. Sensation to touch is normal in the legs.   Labs:  09/23/12: PTH 34.6, calcium 9.4, 25-hydroxy vitamin D 49, 1,25-dihydroxy vitamin D 53, TSH 1.767, free T4 1.21, free T3 2.6 03/24/12: PTH 15.3, calcium 9.8, 25-hydroxy vitamin D 56, 1.25-dihydroxy vitamin D 40, TSH 1.254, free T4 1.29, free T3 2.5.   BMD: The patient's bone mineral density study performed on 02/16/2011 show that she had lost approximately 2.4-3.7% of mineral density in the past year.  ASSESSMENT: 1. Osteoporosis: The patient osteoporosis was slightly worse last year. However, based on recommendations of her bone mineral density specialist, Dr. Valarie Cones, we have not resumed treatment with bisphosphonates. The patient would like to wait until after we do her  next BMD in 2014 before checking to see if she should have a return visit with Dr. Valarie Cones.  2. Vitamin D deficiency: Vitamin D levels are currently quite good. 3. Secondary hyperparathyroidism: Her parathyroid hormone levels are now well-controlled. Her PTH is now mid-range normal. 4. Goiter: Thyroid size is stable. She remains euthyroid. 5. Hypocalcemia: Her serum calcium has decreased slightly below the 50%. I would still like to see it somewhat higher over time to provide her adequate  amounts of calcium to foster better calcium uptake by bone. 6. Hypertension: Her BP is higher again today. Although the higher BP may be due to the combination of less exercise and more edema, the BP is high enough so that we should begin treatment today. I have recommended low-dose lisinopril and have discussed the potential adverse effects of cough and angioneurotic edema. She concurs with starting treatment now.  7. Pedal edema: She is taking in more salt than her kidneys and cardiovascular system can handle.   PLAN: 1. Diagnostic: Will repeat CMP today. Will repeat her BMD in the Spring of 2014. I gave her a scrip to go to [place] since they had done her BMDs in the past. Will repeat her PTH, calcium, 25-vitamin D, 1,25 vitamin D, and CMP at next visit. Come by in 2 weeks for a BP check.  2. Therapeutic: Will start lisinopril, 2.5 mg/day after we see the results of her CMP today. Adjust the lisinopril dose as needed. Reduce salt intake. After we see the results of her next BMD in 2014, we'll see if we need to contact Dr. Valarie Cones to obtain a follow-up appointment. 3. Patient education: We discussed the current recommendations from the bone mineral density specialist as to calcium and vitamin D intake. I've asked her to try to increase the amount of calcium which she obtains from natural sources. We also discussed the issues of hypertension and edema at length.  4. Follow-up: The patient will have a follow-up appointment in 6 months.  Level of Service: This visit lasted in excess of 40 minutes. More than 50% of the visit was devoted to counseling.  David Stall

## 2012-10-04 LAB — COMPREHENSIVE METABOLIC PANEL
ALT: 16 U/L (ref 0–35)
Albumin: 4.6 g/dL (ref 3.5–5.2)
CO2: 29 mEq/L (ref 19–32)
Calcium: 9.7 mg/dL (ref 8.4–10.5)
Chloride: 99 mEq/L (ref 96–112)
Potassium: 4 mEq/L (ref 3.5–5.3)
Total Protein: 7.7 g/dL (ref 6.0–8.3)

## 2013-02-28 ENCOUNTER — Other Ambulatory Visit: Payer: Self-pay | Admitting: *Deleted

## 2013-02-28 DIAGNOSIS — E559 Vitamin D deficiency, unspecified: Secondary | ICD-10-CM

## 2013-03-13 ENCOUNTER — Other Ambulatory Visit: Payer: Self-pay | Admitting: *Deleted

## 2013-03-13 LAB — COMPLETE METABOLIC PANEL WITH GFR
ALT: 13 U/L (ref 0–35)
BUN: 17 mg/dL (ref 6–23)
CO2: 27 mEq/L (ref 19–32)
Creat: 0.95 mg/dL (ref 0.50–1.10)
GFR, Est African American: 69 mL/min
GFR, Est Non African American: 60 mL/min
Total Bilirubin: 0.4 mg/dL (ref 0.3–1.2)

## 2013-03-14 ENCOUNTER — Telehealth: Payer: Self-pay | Admitting: *Deleted

## 2013-03-14 LAB — VITAMIN D 25 HYDROXY (VIT D DEFICIENCY, FRACTURES): Vit D, 25-Hydroxy: 50 ng/mL (ref 30–89)

## 2013-03-14 LAB — PARATHYROID HORMONE, INTACT (NO CA): PTH: 70.5 pg/mL (ref 14.0–72.0)

## 2013-03-14 NOTE — Telephone Encounter (Signed)
TC to [facility] per Dr. Fransico Michael that  the bone mineral density in her spine has increased by 2.6% since last year. The bone mineral density in her radius, however, has decreased by 9.4%. It's important to maintain her exercise, calcium intake, and vitamin D intake. Danielle Carson

## 2013-03-19 LAB — VITAMIN D 1,25 DIHYDROXY
Vitamin D2 1, 25 (OH)2: <8 pg/mL
Vitamin D3 1, 25 (OH)2: 64 pg/mL

## 2013-04-03 ENCOUNTER — Ambulatory Visit (INDEPENDENT_AMBULATORY_CARE_PROVIDER_SITE_OTHER): Payer: BC Managed Care – PPO | Admitting: "Endocrinology

## 2013-04-03 ENCOUNTER — Encounter: Payer: Self-pay | Admitting: "Endocrinology

## 2013-04-03 VITALS — BP 155/82 | HR 67 | Wt 159.4 lb

## 2013-04-03 DIAGNOSIS — M79609 Pain in unspecified limb: Secondary | ICD-10-CM

## 2013-04-03 DIAGNOSIS — M79674 Pain in right toe(s): Secondary | ICD-10-CM

## 2013-04-03 DIAGNOSIS — M810 Age-related osteoporosis without current pathological fracture: Secondary | ICD-10-CM

## 2013-04-03 DIAGNOSIS — I10 Essential (primary) hypertension: Secondary | ICD-10-CM

## 2013-04-03 DIAGNOSIS — R609 Edema, unspecified: Secondary | ICD-10-CM

## 2013-04-03 DIAGNOSIS — E049 Nontoxic goiter, unspecified: Secondary | ICD-10-CM

## 2013-04-03 DIAGNOSIS — R600 Localized edema: Secondary | ICD-10-CM

## 2013-04-03 DIAGNOSIS — E559 Vitamin D deficiency, unspecified: Secondary | ICD-10-CM

## 2013-04-03 DIAGNOSIS — E211 Secondary hyperparathyroidism, not elsewhere classified: Secondary | ICD-10-CM

## 2013-04-03 MED ORDER — LOSARTAN POTASSIUM 50 MG PO TABS: 50.0000 mg | ORAL_TABLET | Freq: Every day | ORAL | Status: DC

## 2013-04-03 NOTE — Patient Instructions (Addendum)
Follow up visit in 3 months. Please come in in about 2 weeks for a BP check. Please repeat blood tests in 3 months and in 6 months.

## 2013-04-03 NOTE — Progress Notes (Signed)
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 She was unaccompanied for today's visit.   1. The patient was referred to me on 07/15/2005 for evaluation of osteoporosis by her former primary care provider, Dr. [name], M.D.   A. The patient developed bilateral hip joint pains in the late 1990s. In 1996, Dr. [name] of [facility], ordered a bone mineral density study. That study showed significant osteoporosis in the spine and in both hips.  The patient was treated with calcium carbonate and with a multivitamin that included vitamin D. Despite taking these medications and having a reasonably robust exercise regimen, the patient's osteoporosis worsened. In 1999 Dr. [name]  performed bilateral hip replacements. Treatment with Fosamax was begun at about that time. In the intervening years bone mineral density had not changed significantly. She was treated with Boniva for several months, but just forgot to take it. Her past medical history revealed that she had had arthritis in many joints for many years. She had undergone natural menopause about 20 years prior. She did take estrogen for one or two years, then stopped that medication. Her surgeries included bilateral hip replacements and bilateral vein stripping. 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 Family history revealed that 5 of her 6 siblings had osteoporosis. Two of those had celiac disease when younger. A sister and brother had thyroid problems as well.    B. Her physical examination was completely unremarkable. Laboratory data revealed that her PTH was 75.5, which was at the upper limit of normal. Her calcium was 8.7, which was in the lower 15% of the normal range. Her 25-hydroxy vitamin D was 39 which was normal. Her 1,25 vitamin D  was 51 which was also normal. I felt that she had relative hyperparathyroidism due to relatively low calcium and vitamin D intake. I asked her to increase her calcium and vitamin D intake. On subsequent testing on 09/30/2005, her PTH was 37.0, calcium 9.3, 25-vitamin D 47, and 1,25-vitamin D 55.   2. In the interim the patient has done well overall. Because it was unclear how long she should remain on Fosamax treatment, I recommended that she obtain a second opinion from Dr. Gwendolyn Grant at [facility]. She saw Dr. Valarie Cones in June of 2009. He recommended that she not take either Fosamax or Boniva treatments. He felt that she had derived all of the benefit from Fosamax that she could. He recommended that she continue her calcium, vitamin D, and exercise. Since then she has had a left hip revision procedure performed.  She continued to have follow-up visits with Dr. [name] in orthopedics. A bone mineral density study performed on 02/16/2011 showed that her bone mineral density probably declined about 2.4-3.7% in the last 3 years. Since that study she has also had two hip dislocation procedures performed.  3.The patient's last [facility] visit was on 10/03/12. In the interim she developed a dry cough and had wide swings in blood pressure, so she stopped the lisinopril.  She has not had any more dislocations of the left hip. She notes frequent swelling of her ankles after sitting at the computer for hours at a time. She does not think that she is taking in too much salt, except for crackers and salty peanuts. Otherwise she has been pretty healthy and has been doing well. She has  been taking calcium once in the morning and once at bedtime. She also inadvertently purchased a calcium tablet with less calcium content than she had been taking before.  4. Pertinent Review of Systems: Constitutional: The patient feels well, is healthy, and has no significant complaints. Eyes: Vision is  good with her glasses. There are no significant eye complaints. Neck: The patient has no complaints of anterior neck swelling, soreness, tenderness,  pressure, discomfort, or difficulty swallowing. She continues to have tension and pains in her posterior neck and shoulder girdle. Heart: Heart rate increases with exercise or other physical activity. The patient has no complaints of palpitations, irregular heat beats, chest pain, or chest pressure. Gastrointestinal: Bowel movents seem normal. The patient has no complaints of excessive hunger, acid reflux, upset stomach, stomach aches or pains, diarrhea, or constipation. Legs:She still takes a probiotic to control her reflux. Muscle mass and strength seem normal. There are no complaints of numbness, tingling, or burning. She has occasional leg muscle pains after walking. She has more frequent edema if she sits too long.  Feet: There are no obvious foot problems. There are no complaints of numbness, tingling, burning, or pain. No edema is noted.  PAST MEDICAL, FAMILY, AND SOCIAL HISTORY: 1. Work and family: Her son is a Midwife in the [employer] who is stationed in [place], [place] with the [employer]. He will return to [facility] this coming Summer. 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  2. Activities: She is more physically active as she was. She tries to walk at least 3 miles per day about three times per week, sometimes 7-8 miles per day.  3. Primary care provider: She now sees the PA, Arnette Felts, at [facility]  REVIEW OF SYSTEMS: There are no other significant problems involving her other body systems.  PHYSICAL EXAM: BP 155/82  Pulse 67  Wt 159 lb 6.4 oz (72.303 kg)  BMI 23.88 kg/m2  Constitutional: The patient looks healthy, physically and emotionally well, slimmer, and trimmer. The patient is alert, bright, and very mentally sharp. Eyes: There is no arcus or proptosis.  Mouth: The oropharynx appears normal. The  tongue appears normal. There is normal oral moisture. There is no obvious gingivitis. Neck: There are no bruits present. The thyroid gland appears normal in size. The thyroid gland is 18-20 grams in size. The consistency of the thyroid gland is normal. There is no thyroid tenderness to palpation. Lungs: The lungs are clear. Air movement is good. Heart: The heart rhythm and rate appear normal. Heart sounds S1 and S2 are normal. I do not appreciate any pathologic heart murmurs. Abdomen: The abdominal size is normal. Bowel sounds are normal. The abdomen is soft and non-tender. There is no obviously palpable hepatomegaly, splenomegaly, or other masses.  Arms: Muscle mass appears appropriate for age.  Hands: There is no obvious tremor. Phalangeal and metacarpophalangeal joints appear normal. Palms are normal. Legs: Muscle mass appears appropriate for age. There is trace to 1+ edema bilaterally.  Neurologic: Muscle strength is normal for age and gender  in both the upper and the lower extremities. Muscle tone appears normal. Sensation to touch is normal in the legs.   Labs:  02/28/13: PTH 70.5, calcium 9.3, 25-hydroxy vitamin D 50, 1,25-dihydroxy vitamin D 64,  09/23/12: PTH 34.6, calcium 9.4, 25-hydroxy vitamin D 49, 1,25-dihydroxy vitamin D 53, TSH 1.767, free T4 1.21, free T3 2.6 03/24/12: PTH 15.3, calcium 9.8, 25-hydroxy vitamin D 56, 1.25-dihydroxy vitamin D 40,  TSH 1.254, free T4 1.29, free T3 2.5.   BMD: The patient's bone mineral density study performed recently showed a 2.6% increase of BMD in the spine, but a 9.4% decrease in the wrist.   ASSESSMENT: 1. Osteoporosis: The patient osteoporosis was slightly better in the spine, but worse in the wrist. Based upon the improvement of BMD in her spine and the knowledge that her calcium intake has decreased, I think that if we can increase the calcium intake sufficiently she will not need to resume bisphosphonates.  2. Vitamin D deficiency: Vitamin D  levels are currently quite good. 3. Secondary hyperparathyroidism: Her parathyroid hormone levels have increased  as her calcium decreased. In retrospect, she has decreased her calcium dose slightly and has been taking the evening calcium without a meal. She needs to increase her intake of calcium. I would like to see her calcium in the upper quartile of the normal frange.   4. Goiter: Thyroid size is stable. She remains euthyroid. 5. 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 I would still like to see it higher over time to provide her adequate amounts of calcium to foster better calcium uptake by bone. 6. Hypertension: Her BP is higher again today. Although the higher BP may be due to the combination of less exercise and more edema, the BP is high enough so that we should begin treatment today. Since she can't tolerate lisinopril, we need to try an ARB, such as losartan. She concurs with starting treatment now.  7. Pedal edema: She is taking in more salt than her kidneys and cardiovascular system can handle.  8. Right toe pain: She has been having pains in her right great toe for months. Her mother had the same problem. It's time to rule out gout.  PLAN: 1. Diagnostic: Will repeat Calcium and PTH and uric acid in 3 months. Will repeat her PTH, calcium, 25-vitamin D, TFTs and CMP at next visit. Come by in 2 weeks for a BP check.  2. Therapeutic: Will start losartan, 25 mg (1/2 of a 50 mg tablet) each AM. Reduce salt intake.  3. Patient education: We discussed the current recommendations from bone mineral density specialists as to calcium and vitamin D intake. I've asked her to try to increase the amount of calcium which she obtains from natural sources, but also to take high quality calcium supplements such as Tums, Oscal, and Citracal. We also discussed the issues of hypertension and edema at length.  4. Follow-up: The patient will have a follow-up appointment in 6  months.  Level of Service: This visit lasted in excess of 40 minutes. More than 50% of the visit was devoted to counseling.  David Stall

## 2013-04-06 LAB — HM DIABETES EYE EXAM

## 2013-04-12 LAB — HM PAP SMEAR: HM Pap smear: NORMAL

## 2013-04-22 ENCOUNTER — Encounter (HOSPITAL_BASED_OUTPATIENT_CLINIC_OR_DEPARTMENT_OTHER): Payer: Self-pay | Admitting: Emergency Medicine

## 2013-04-22 ENCOUNTER — Emergency Department (HOSPITAL_BASED_OUTPATIENT_CLINIC_OR_DEPARTMENT_OTHER)
Admission: EM | Admit: 2013-04-22 | Discharge: 2013-04-22 | Disposition: A | Discharge status: Home / Self Care | Payer: BC Managed Care – PPO | Attending: Emergency Medicine | Admitting: Emergency Medicine

## 2013-04-22 ENCOUNTER — Emergency Department (HOSPITAL_BASED_OUTPATIENT_CLINIC_OR_DEPARTMENT_OTHER): Payer: BC Managed Care – PPO

## 2013-04-22 DIAGNOSIS — Z7982 Long term (current) use of aspirin: Secondary | ICD-10-CM | POA: Insufficient documentation

## 2013-04-22 DIAGNOSIS — K5732 Diverticulitis of large intestine without perforation or abscess without bleeding: Secondary | ICD-10-CM | POA: Insufficient documentation

## 2013-04-22 DIAGNOSIS — I2510 Atherosclerotic heart disease of native coronary artery without angina pectoris: Secondary | ICD-10-CM | POA: Insufficient documentation

## 2013-04-22 DIAGNOSIS — K5792 Diverticulitis of intestine, part unspecified, without perforation or abscess without bleeding: Secondary | ICD-10-CM

## 2013-04-22 DIAGNOSIS — Z8719 Personal history of other diseases of the digestive system: Secondary | ICD-10-CM | POA: Insufficient documentation

## 2013-04-22 DIAGNOSIS — Z79899 Other long term (current) drug therapy: Secondary | ICD-10-CM | POA: Insufficient documentation

## 2013-04-22 HISTORY — DX: Diverticulosis of intestine, part unspecified, without perforation or abscess without bleeding: K57.90

## 2013-04-22 LAB — CBC WITH DIFFERENTIAL/PLATELET
Basophils Absolute: 0 10*3/uL (ref 0.0–0.1)
Eosinophils Relative: 0 % (ref 0–5)
HCT: 38.7 % (ref 36.0–46.0)
Lymphocytes Relative: 6 % — ABNORMAL LOW (ref 12–46)
Lymphs Abs: 0.7 10*3/uL (ref 0.7–4.0)
MCH: 31.5 pg (ref 26.0–34.0)
MCV: 86.4 fL (ref 78.0–100.0)
Monocytes Absolute: 1.3 10*3/uL — ABNORMAL HIGH (ref 0.1–1.0)
RDW: 12.6 % (ref 11.5–15.5)
WBC: 11.6 10*3/uL — ABNORMAL HIGH (ref 4.0–10.5)

## 2013-04-22 LAB — URINALYSIS, ROUTINE W REFLEX MICROSCOPIC
Leukocytes, UA: NEGATIVE
Protein, ur: NEGATIVE mg/dL
Urobilinogen, UA: 0.2 mg/dL (ref 0.0–1.0)

## 2013-04-22 LAB — COMPREHENSIVE METABOLIC PANEL
CO2: 24 mEq/L (ref 19–32)
Calcium: 9.3 mg/dL (ref 8.4–10.5)
Creatinine, Ser: 0.9 mg/dL (ref 0.50–1.10)
GFR calc Af Amer: 72 mL/min — ABNORMAL LOW (ref >90)
GFR calc non Af Amer: 62 mL/min — ABNORMAL LOW (ref >90)
Glucose, Bld: 117 mg/dL — ABNORMAL HIGH (ref 70–99)

## 2013-04-22 MED ORDER — CIPROFLOXACIN HCL 500 MG PO TABS: 500.0000 mg | ORAL_TABLET | Freq: Two times a day (BID) | ORAL | Status: DC

## 2013-04-22 MED ORDER — DICYCLOMINE HCL 20 MG PO TABS: 20.0000 mg | ORAL_TABLET | Freq: Two times a day (BID) | ORAL | Status: DC

## 2013-04-22 MED ORDER — ONDANSETRON HCL 4 MG/2ML IJ SOLN
4.0000 mg | Freq: Once | INTRAMUSCULAR | Status: AC
  Administered 2013-04-22: 4 mg via INTRAVENOUS
  Filled 2013-04-22: qty 2

## 2013-04-22 MED ORDER — IOHEXOL 300 MG/ML  SOLN
50.0000 mL | Freq: Once | INTRAMUSCULAR | Status: AC | PRN
  Administered 2013-04-22: 50 mL via ORAL

## 2013-04-22 MED ORDER — IOHEXOL 300 MG/ML  SOLN
100.0000 mL | Freq: Once | INTRAMUSCULAR | Status: AC | PRN
  Administered 2013-04-22: 100 mL via INTRAVENOUS

## 2013-04-22 MED ORDER — SODIUM CHLORIDE 0.9 % IV SOLN
1000.0000 mL | Freq: Once | INTRAVENOUS | Status: AC
  Administered 2013-04-22: 1000 mL via INTRAVENOUS

## 2013-04-22 MED ORDER — METRONIDAZOLE 500 MG PO TABS: 500.0000 mg | ORAL_TABLET | Freq: Three times a day (TID) | ORAL | Status: DC

## 2013-04-22 NOTE — ED Notes (Signed)
Pt has ambulated to restroom to provide urine specimen

## 2013-04-22 NOTE — Discharge Instructions (Signed)
As discussed, it is important that you follow up as soon as possible with your physician for continued management of your condition.  If you develop any new, or concerning changes in your condition, please return to the emergency department immediately.    Diverticulitis A diverticulum is a small pouch or sac on the colon. Diverticulosis is the presence of these diverticula on the colon. Diverticulitis is the irritation (inflammation) or infection of diverticula. CAUSES  The colon and its diverticula contain bacteria. If food particles block the tiny opening to a diverticulum, the bacteria inside can grow and cause an increase in pressure. This leads to infection and inflammation and is called diverticulitis. SYMPTOMS   Abdominal pain and tenderness. Usually, the pain is located on the left side of your abdomen. However, it could be located elsewhere.  Fever.  Bloating.  Feeling sick to your stomach (nausea).  Throwing up (vomiting).  Abnormal stools. DIAGNOSIS  Your caregiver will take a history and perform a physical exam. Since many things can cause abdominal pain, other tests may be necessary. Tests may include:  Blood tests.  Urine tests.  X-ray of the abdomen.  CT scan of the abdomen. Sometimes, surgery is needed to determine if diverticulitis or other conditions are causing your symptoms. TREATMENT  Most of the time, you can be treated without surgery. Treatment includes:  Resting the bowels by only having liquids for a few days. As you improve, you will need to eat a low-fiber diet.  Intravenous (IV) fluids if you are losing body fluids (dehydrated).  Antibiotic medicines that treat infections may be given.  Pain and nausea medicine, if needed.  Surgery if the inflamed diverticulum has burst. HOME CARE INSTRUCTIONS   Try a clear liquid diet (broth, tea, or water for as long as directed by your caregiver). You may then gradually begin a low-fiber diet as  tolerated. A low-fiber diet is a diet with less than 10 grams of fiber. Choose the foods below to reduce fiber in the diet:  White breads, cereals, rice, and pasta.  Cooked fruits and vegetables or soft fresh fruits and vegetables without the skin.  Ground or well-cooked tender beef, ham, veal, lamb, pork, or poultry.  Eggs and seafood.  After your diverticulitis symptoms have improved, your caregiver may put you on a high-fiber diet. A high-fiber diet includes 14 grams of fiber for every 1000 calories consumed. For a standard 2000 calorie diet, you would need 28 grams of fiber. Follow these diet guidelines to help you increase the fiber in your diet. It is important to slowly increase the amount fiber in your diet to avoid gas, constipation, and bloating.  Choose whole-grain breads, cereals, pasta, and brown rice.  Choose fresh fruits and vegetables with the skin on. Do not overcook vegetables because the more vegetables are cooked, the more fiber is lost.  Choose more nuts, seeds, legumes, dried peas, beans, and lentils.  Look for food products that have greater than 3 grams of fiber per serving on the Nutrition Facts label.  Take all medicine as directed by your caregiver.  If your caregiver has given you a follow-up appointment, it is very important that you go. Not going could result in lasting (chronic) or permanent injury, pain, and disability. If there is any problem keeping the appointment, call to reschedule. SEEK MEDICAL CARE IF:   Your pain does not improve.  You have a hard time advancing your diet beyond clear liquids.  Your bowel movements do not  return to normal. SEEK IMMEDIATE MEDICAL CARE IF:   Your pain becomes worse.  You have an oral temperature above 102 F (38.9 C), not controlled by medicine.  You have repeated vomiting.  You have bloody or black, tarry stools.  Symptoms that brought you to your caregiver become worse or are not getting better. MAKE  SURE YOU:   Understand these instructions.  Will watch your condition.  Will get help right away if you are not doing well or get worse. Document Released: 09/02/2005 Document Revised: 02/15/2012 Document Reviewed: 12/29/2010 [facility] Patient Information 2013 [place], [place].

## 2013-04-22 NOTE — ED Notes (Signed)
Danielle Carson, CT tech will be speaking with patient shortly regarding CT contrast

## 2013-04-22 NOTE — ED Provider Notes (Addendum)
History     CSN: [id number]  Arrival date & time 04/22/13  1156   First MD Initiated Contact with Patient 04/22/13 1234      Chief Complaint  Patient presents with  . Abdominal Pain    (Consider location/radiation/quality/duration/timing/severity/associated sxs/prior treatment) HPI Patient presents with lower abdominal pain.  The pain has been present for approximately 10 days, remained present despite initiating with ciprofloxacin and Flagyl therapy. The pain is lower, left-sided, crampy, associated with decreased by mouth intake, change in caliber of bowel movements. There is no associated fever, nor chest pain, dyspnea, syncope. The patient has a history of diverticulitis, multiple times, with no prior surgical repair. Pain is minimally improved with rest, seems provoked with by mouth intake. Past Medical History  Diagnosis Date  . Coronary artery disease   . Hiatal hernia   . Diverticulosis     Past Surgical History  Procedure Laterality Date  . Joint replacement      bilat hip replacements and left hip revision  . Hip closed reduction  03/17/2012    Procedure: CLOSED MANIPULATION HIP;  Surgeon: Nestor Lewandowsky, MD;  Location: [facility];  Service: Orthopedics;  Laterality: Left;    Family History  Problem Relation Age of Onset  . Cardiomyopathy Mother   . Osteoarthritis Mother   . Cardiomyopathy Sister   . Osteoarthritis Sister     History  Substance Use Topics  . Smoking status: Never Smoker   . Smokeless tobacco: Never Used  . Alcohol Use: 3.6 oz/week    6 Glasses of wine per week    OB History   Grav Para Term Preterm Abortions TAB SAB Ect Mult Living                  Review of Systems  Constitutional:       Per HPI, otherwise negative  HENT:       Per HPI, otherwise negative  Respiratory:       Per HPI, otherwise negative  Cardiovascular:       Per HPI, otherwise negative  Gastrointestinal: Negative for vomiting.  Endocrine:       Negative aside  from HPI  Genitourinary:       Neg aside from HPI   Musculoskeletal:       Per HPI, otherwise negative  Skin: Negative.   Neurological: Negative for syncope.    Allergies  Aspirin  Home Medications   Current Outpatient Rx  Name  Route  Sig  Dispense  Refill  . ciprofloxacin (CIPRO) 500 MG tablet   Oral   Take 500 mg by mouth 2 (two) times daily.         . metroNIDAZOLE (FLAGYL) 500 MG tablet   Oral   Take 500 mg by mouth 3 (three) times daily.         . Probiotic Product (SOLUBLE FIBER/PROBIOTICS PO)   Oral   Take by mouth.         Marland Kitchen aspirin 81 MG tablet   Oral   Take 81 mg by mouth daily.         . B Complex Vitamins (VITAMIN-B COMPLEX PO)   Oral   Take 1 capsule by mouth daily.         . calcium-vitamin D (OSCAL WITH D) 500-200 MG-UNIT per tablet   Oral   Take 1 tablet by mouth 2 (two) times daily.           Marland Kitchen glucosamine-chondroitin 500-400 MG tablet  Oral   Take 1 tablet by mouth 3 (three) times daily. OTC          . losartan (COZAAR) 50 MG tablet   Oral   Take 1 tablet (50 mg total) by mouth daily.   30 tablet   6   . Multiple Vitamin (MULTIVITAMIN) tablet   Oral   Take 1 tablet by mouth daily.             BP 176/72  Pulse 86  Temp(Src) 97.9 F (36.6 C) (Oral)  Resp 18  Ht 5\' 9"  (1.753 m)  Wt 152 lb 4 oz (69.06 kg)  BMI 22.47 kg/m2  SpO2 98%  Physical Exam  Nursing note and vitals reviewed. Constitutional: She is oriented to person, place, and time. She appears well-developed and well-nourished. No distress.  HENT:  Head: Normocephalic and atraumatic.  Eyes: Conjunctivae and EOM are normal.  Cardiovascular: Normal rate and regular rhythm.   Pulmonary/Chest: Effort normal and breath sounds normal. No stridor. No respiratory distress.  Abdominal: Soft. Normal appearance and bowel sounds are normal. She exhibits no distension. There is no hepatosplenomegaly. There is tenderness in the left lower quadrant. There is no  rigidity, no rebound and no guarding.  Musculoskeletal: She exhibits no edema.  Neurological: She is alert and oriented to person, place, and time. No cranial nerve deficit.  Skin: Skin is warm and dry.  Psychiatric: She has a normal mood and affect.    ED Course  Procedures (including critical care time)  Labs Reviewed  CBC WITH DIFFERENTIAL  COMPREHENSIVE METABOLIC PANEL  LIPASE, BLOOD  URINALYSIS, ROUTINE W REFLEX MICROSCOPIC   No results found.   No diagnosis found.   3:37 PM On repeat exam the patient appears in no distress.  We discussed the results at length, including CT results. Given the patient is afebrile, and in no distress, she will continue her course of Cipro Flagyl, with a GI followup in 2 days.  We discussed the addition of antispasmodic.  We also discussed return precautions.  3:41 PM Patient is ambulating about the building.  Clearly in no distress.   Date: 04/22/2013  Rate: 84  Rhythm: normal sinus rhythm  QRS Axis: left  Intervals: normal  ST/T Wave abnormalities: normal  Conduction Disutrbances:none  Narrative Interpretation:   Old EKG Reviewed: none available borderline  MDM  This patient with a history of diverticulitis presents with ongoing left lower quadrant spasm. On exam the patient is awake and alert, with tenderness only in the aforementioned area.  There is no evidence of peritonitis.  Given her description of pain, bowel habit changes, her history, CT was performed to rule out diverticulitis.  This was identified.  The patient was discharged in stable condition after a lengthy discussion on the need for followup, continue using antibiotics.        Gerhard Munch, MD 04/22/13 1538  Gerhard Munch, MD 04/22/13 [phone]

## 2013-04-22 NOTE — ED Notes (Signed)
Pt having lower abdominal pain for over one week.  Started to take diverticulitis medication but continues to have pain.  Pt to see gastroenterologist on Monday.  Some diarrhea at end of medication dosing.  No nausea or vomiting.  No known fever.

## 2013-06-05 LAB — HM COLONOSCOPY: HM Colonoscopy: NORMAL

## 2013-06-08 ENCOUNTER — Encounter: Payer: Self-pay | Admitting: Internal Medicine

## 2013-06-08 ENCOUNTER — Other Ambulatory Visit (INDEPENDENT_AMBULATORY_CARE_PROVIDER_SITE_OTHER): Payer: BC Managed Care – PPO

## 2013-06-08 ENCOUNTER — Ambulatory Visit (INDEPENDENT_AMBULATORY_CARE_PROVIDER_SITE_OTHER): Payer: BC Managed Care – PPO | Admitting: Internal Medicine

## 2013-06-08 VITALS — BP 112/66 | HR 64 | Temp 97.6°F | Resp 16 | Ht 68.0 in | Wt 146.0 lb

## 2013-06-08 DIAGNOSIS — M810 Age-related osteoporosis without current pathological fracture: Secondary | ICD-10-CM

## 2013-06-08 DIAGNOSIS — E211 Secondary hyperparathyroidism, not elsewhere classified: Secondary | ICD-10-CM

## 2013-06-08 DIAGNOSIS — R7309 Other abnormal glucose: Secondary | ICD-10-CM

## 2013-06-08 DIAGNOSIS — I10 Essential (primary) hypertension: Secondary | ICD-10-CM

## 2013-06-08 DIAGNOSIS — E782 Mixed hyperlipidemia: Secondary | ICD-10-CM

## 2013-06-08 LAB — BASIC METABOLIC PANEL
GFR: 63.59 mL/min (ref >60.00)
Glucose, Bld: 102 mg/dL — ABNORMAL HIGH (ref 70–99)
Potassium: 4.5 mEq/L (ref 3.5–5.1)
Sodium: 132 mEq/L — ABNORMAL LOW (ref 135–145)

## 2013-06-08 LAB — CBC WITH DIFFERENTIAL/PLATELET
Basophils Absolute: 0 10*3/uL (ref 0.0–0.1)
Eosinophils Relative: 1 % (ref 0.0–5.0)
MCV: 91.7 fl (ref 78.0–100.0)
Monocytes Absolute: 0.6 10*3/uL (ref 0.1–1.0)
Neutrophils Relative %: 73.9 % (ref 43.0–77.0)
Platelets: 270 10*3/uL (ref 150.0–400.0)
WBC: 5.9 10*3/uL (ref 4.5–10.5)

## 2013-06-08 LAB — LIPID PANEL
HDL: 56.3 mg/dL (ref >39.00)
Total CHOL/HDL Ratio: 3
VLDL: 10.4 mg/dL (ref 0.0–40.0)

## 2013-06-08 NOTE — Progress Notes (Signed)
Subjective:    Patient ID: Danielle Carson, female    DOB: 09-Oct-1941, 72 y.o.   MRN: [id number]  Hyperlipidemia This is a chronic problem. The current episode started more than 1 year ago. The problem is uncontrolled. Recent lipid tests were reviewed and are variable. She has no history of chronic renal disease, diabetes, hypothyroidism, liver disease, obesity or nephrotic syndrome. There are no known factors aggravating her hyperlipidemia. Pertinent negatives include no chest pain, focal sensory loss, focal weakness, leg pain, myalgias or shortness of breath. Current antihyperlipidemic treatment includes exercise and diet change. The current treatment provides moderate improvement of lipids. There are no compliance problems.       Review of Systems  Constitutional: Negative.  Negative for fever, chills, diaphoresis, activity change, appetite change, fatigue and unexpected weight change.  HENT: Negative.   Eyes: Negative.   Respiratory: Negative.  Negative for choking, chest tightness, shortness of breath, wheezing and stridor.   Cardiovascular: Negative.  Negative for chest pain, palpitations and leg swelling.  Gastrointestinal: Negative.  Negative for nausea, vomiting, abdominal pain, diarrhea, constipation and blood in stool.  Endocrine: Negative.   Genitourinary: Negative.   Musculoskeletal: Negative.  Negative for myalgias, back pain, joint swelling, arthralgias and gait problem.  Skin: Negative.   Allergic/Immunologic: Negative.   Neurological: Negative.  Negative for dizziness, tremors, focal weakness, seizures, weakness, light-headedness and numbness.  Hematological: Negative.  Negative for adenopathy. Does not bruise/bleed easily.  Psychiatric/Behavioral: Negative.        Objective:   Physical Exam  Vitals reviewed. Constitutional: She is oriented to person, place, and time. She appears well-developed and well-nourished. No distress.  HENT:  Head: Normocephalic  and atraumatic.  Mouth/Throat: Oropharynx is clear and moist. No oropharyngeal exudate.  Eyes: Conjunctivae are normal. Right eye exhibits no discharge. Left eye exhibits no discharge. No scleral icterus.  Neck: Normal range of motion. Neck supple. No JVD present. No tracheal deviation present. No thyromegaly present.  Cardiovascular: Normal rate, regular rhythm, normal heart sounds and intact distal pulses.  Exam reveals no gallop and no friction rub.   No murmur heard. Pulmonary/Chest: Effort normal and breath sounds normal. No stridor. No respiratory distress. She has no wheezes. She has no rales. She exhibits no tenderness.  Abdominal: Soft. Normal appearance and bowel sounds are normal. She exhibits no shifting dullness, no distension, no pulsatile liver, no fluid wave, no abdominal bruit, no ascites, no pulsatile midline mass and no mass. There is no hepatosplenomegaly, splenomegaly or hepatomegaly. There is no tenderness. There is no rigidity, no rebound, no guarding, no CVA tenderness, no tenderness at McBurney's point and negative Murphy's sign. A hernia is present. Hernia confirmed positive in the ventral area. Hernia confirmed negative in the right inguinal area and confirmed negative in the left inguinal area.    Musculoskeletal: Normal range of motion. She exhibits no edema and no tenderness.  Lymphadenopathy:    She has no cervical adenopathy.  Neurological: She is oriented to person, place, and time.  Skin: Skin is warm and dry. No rash noted. She is not diaphoretic. No erythema. No pallor.  Psychiatric: She has a normal mood and affect. Her behavior is normal. Judgment and thought content normal.     Lab Results  Component Value Date   WBC 11.6* 04/22/2013   HGB 14.1 04/22/2013   HCT 38.7 04/22/2013   PLT 322 04/22/2013   GLUCOSE 117* 04/22/2013   ALT 11 04/22/2013   AST 18 04/22/2013   NA 128* 04/22/2013  K 4.3 04/22/2013   CL 92* 04/22/2013   CREATININE 0.90 04/22/2013   BUN 9  04/22/2013   CO2 24 04/22/2013   TSH 1.767 09/23/2012   INR 1.59* 11/19/2010       Assessment & Plan:

## 2013-06-08 NOTE — Patient Instructions (Signed)

## 2013-06-11 NOTE — Assessment & Plan Note (Signed)
I will check her FLP today 

## 2013-06-11 NOTE — Assessment & Plan Note (Signed)
Her BP is well controlled Today I will check her lytes and renal function 

## 2013-06-11 NOTE — Assessment & Plan Note (Signed)
A1C is consistent with pre-diabetes She will work on her lifestyle modifications

## 2013-06-14 ENCOUNTER — Telehealth: Payer: Self-pay | Admitting: Internal Medicine

## 2013-06-14 NOTE — Telephone Encounter (Signed)
rcv'd records from [facility] forward 174 pages to Dr.Jones

## 2013-06-14 NOTE — Telephone Encounter (Signed)
recv'd records from [facility] forward 174 pages to Dr.

## 2013-07-12 ENCOUNTER — Other Ambulatory Visit: Payer: Self-pay

## 2013-08-31 ENCOUNTER — Other Ambulatory Visit: Payer: Self-pay | Admitting: *Deleted

## 2013-08-31 DIAGNOSIS — M810 Age-related osteoporosis without current pathological fracture: Secondary | ICD-10-CM

## 2013-09-22 LAB — COMPREHENSIVE METABOLIC PANEL
ALT: 14 U/L (ref 0–35)
AST: 20 U/L (ref 0–37)
Alkaline Phosphatase: 54 U/L (ref 39–117)
Chloride: 99 mEq/L (ref 96–112)
Creat: 0.98 mg/dL (ref 0.50–1.10)
Total Bilirubin: 0.5 mg/dL (ref 0.3–1.2)

## 2013-09-23 LAB — VITAMIN D 25 HYDROXY (VIT D DEFICIENCY, FRACTURES): Vit D, 25-Hydroxy: 58 ng/mL (ref 30–89)

## 2013-09-25 LAB — PTH, INTACT AND CALCIUM
Calcium: 9.1 mg/dL (ref 8.4–10.5)
PTH: 34 pg/mL (ref 14.0–72.0)

## 2013-10-04 ENCOUNTER — Ambulatory Visit (INDEPENDENT_AMBULATORY_CARE_PROVIDER_SITE_OTHER): Payer: BC Managed Care – PPO | Admitting: "Endocrinology

## 2013-10-04 ENCOUNTER — Encounter: Payer: Self-pay | Admitting: "Endocrinology

## 2013-10-04 VITALS — BP 143/80 | HR 67 | Wt 149.9 lb

## 2013-10-04 DIAGNOSIS — E049 Nontoxic goiter, unspecified: Secondary | ICD-10-CM

## 2013-10-04 DIAGNOSIS — M810 Age-related osteoporosis without current pathological fracture: Secondary | ICD-10-CM

## 2013-10-04 DIAGNOSIS — M79609 Pain in unspecified limb: Secondary | ICD-10-CM

## 2013-10-04 DIAGNOSIS — N2581 Secondary hyperparathyroidism of renal origin: Secondary | ICD-10-CM

## 2013-10-04 DIAGNOSIS — E559 Vitamin D deficiency, unspecified: Secondary | ICD-10-CM

## 2013-10-04 DIAGNOSIS — R609 Edema, unspecified: Secondary | ICD-10-CM

## 2013-10-04 DIAGNOSIS — M62838 Other muscle spasm: Secondary | ICD-10-CM

## 2013-10-04 DIAGNOSIS — I10 Essential (primary) hypertension: Secondary | ICD-10-CM

## 2013-10-04 DIAGNOSIS — R600 Localized edema: Secondary | ICD-10-CM

## 2013-10-04 DIAGNOSIS — M79674 Pain in right toe(s): Secondary | ICD-10-CM

## 2013-10-04 HISTORY — DX: Other muscle spasm: M62.838

## 2013-10-04 NOTE — Progress Notes (Signed)
Chief complaint: Follow-up of osteoporosis, vitamin D deficiency disease, secondary hyperparathyroidism, hypocalcemia, hyperlipidemia, and goiter  History of present illness: Dr. (Ph.D.) Danielle Carson is a 72 year old Caucasian woman. She was unaccompanied for today's visit.   1. The patient was referred to me on 07/15/2005 for evaluation of osteoporosis by her former primary care provider, Dr. Sharlet Carson, M.D.   A. The patient developed bilateral hip joint pains in the late 1990s. In 1996, Dr. Gean Carson of [facility], ordered a bone mineral density study. That study showed significant osteoporosis in the spine and in both hips.  The patient was treated with calcium carbonate and with a multivitamin that included vitamin D. Despite taking these medications and having a reasonably robust exercise regimen, the patient's osteoporosis worsened. In 1999 Dr. Turner Carson  performed bilateral hip replacements. Treatment with Fosamax was begun at about that time. In the intervening years bone mineral density had not changed significantly. She was treated with Boniva for several months, but just forgot to take it. Her past medical history revealed that she had had arthritis in many joints for many years. She had undergone natural menopause about 20 years prior. She did take estrogen for one or two years, then stopped that medication. Her surgeries included bilateral hip replacements and bilateral vein stripping. 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 Family history revealed that 5 of her 6 siblings had osteoporosis. Two of those had celiac disease when younger. A sister and brother had thyroid problems as well.    B. Her physical examination was completely unremarkable. Laboratory data revealed that her PTH was 75.5, which was at the upper limit of normal. Her calcium was 8.7, which was in the lower 15% of the normal range. Her 25-hydroxy vitamin D was 39  which was normal. Her 1,25 vitamin D was 51 which was also normal. I felt that she had relative hyperparathyroidism due to relatively low calcium and vitamin D intake. I asked her to increase her calcium and vitamin D intake. On subsequent testing on 09/30/2005, her PTH was 37.0, calcium 9.3, 25-vitamin D 47, and 1,25-vitamin D 55.   2. During the past eight years the patient has done well overall. Because it was unclear how long she should remain on Fosamax treatment, I recommended that she obtain a second opinion from Dr. [name] at [facility]. She saw Dr. [name] in June of 2009. He recommended that she not take either Fosamax or Boniva treatments. He felt that she had derived all of the benefit from Fosamax that she could. He recommended that she continue her calcium, vitamin D, and exercise. Since then she has had a left hip revision procedure performed.  She continued to have follow-up visits with Dr. Turner Carson in orthopedics. A bone mineral density study performed on 02/16/2011 showed that her bone mineral density probably declined about 2.4-3.7% in the last 3 years. Since that study she has also had two hip dislocation procedures performed.  3.The patient's last [facility] visit was on 04/03/13. In the interim she developed diverticulitis in May, which was successfully treated with antibiotics. Her CT scan and colonoscopy were normal. She had a recurrence of diverticulitis in June that was also successfully treated with antibiotics. In the process she lost about 15 pounds. She also underwent a banding procedure for her hemorrhoids. She tolerates her losartan very well. She remains on calcium with vitamin D twice daily. She has not had any  more dislocations of the left hip. She has not had much leg edema recently. Otherwise she has been pretty healthy and has been doing well.   4. Pertinent Review of Systems: Constitutional: The patient feels well, is healthy, and has  no significant complaints. She does have more trapezius and nuchal tension, especially when she spends a lot of time at her computer..  Eyes: Vision is good with her glasses. There are no significant eye complaints. Neck: The patient has no complaints of anterior neck swelling, soreness, tenderness,  pressure, discomfort, or difficulty swallowing.  Heart: Heart rate increases with exercise or other physical activity. The patient has no complaints of palpitations, irregular heat beats, chest pain, or chest pressure. Gastrointestinal: As above. She still takes a probiotic to control her reflux. Bowel movents seem normal. The patient has no complaints of excessive hunger, acid reflux, upset stomach, stomach aches or pains, diarrhea, or constipation. Legs: Muscle mass and strength seem normal. There are no complaints of numbness, tingling, or burning. She has occasional leg muscle pains after walking. She has more frequent edema if she sits too long.  Feet: There are no obvious foot problems, except for arthritis in her toes. There are no complaints of numbness, tingling, burning, or pain. No edema is noted.  PAST MEDICAL, FAMILY, AND SOCIAL HISTORY: 1. Work and family: Her son is a Midwife in the [employer] who is now stationed in [place]. 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  2. Activities: She is not as physically active as she was. She tries to walk about three times per week. She also uses a stair-stepper frequently.   3. Primary care provider: She now sees Danielle Carson at [facility].  REVIEW OF SYSTEMS: There are no other significant problems involving her other body systems.  PHYSICAL EXAM: BP 143/80  Pulse 67  Wt 149 lb 14.4 oz (67.994 kg)  BMI 22.8 kg/m2 Repeat BP was 133/81.  Constitutional: The patient looks healthy, physically and emotionally well, but somewhat slimmer. She has lost 10 pounds since her last visit. She is alert, bright, and very mentally sharp. Eyes:  There is no arcus or proptosis.  Mouth: The oropharynx appears normal. The tongue appears normal. There is normal oral moisture. There is no obvious gingivitis. Neck: There are no bruits present. The thyroid gland appears normal in size. The thyroid gland is 18-20 grams in size. The consistency of the thyroid gland is normal. There is no thyroid tenderness to palpation. Her trapezius muscles are tight and tender. Her nuchal cords are tight.   Lungs: The lungs are clear. Air movement is good. Heart: The heart rhythm and rate appear normal. Heart sounds S1 and S2 are normal. I do not appreciate any pathologic heart murmurs. Abdomen: The abdomen is normal in size. Bowel sounds are normal. The abdomen is soft and non-tender. There is no obviously palpable hepatomegaly, splenomegaly, or other masses.  Arms: Muscle mass appears appropriate for age.  Hands: There is no obvious tremor. Phalangeal and metacarpophalangeal joints appear normal. Palms are normal. Legs: Muscle mass appears appropriate for age. There is trace edema bilaterally.  Neurologic: Muscle strength is normal for age and gender  in both the upper and the lower extremities. Muscle tone appears normal. Sensation to touch is normal in the legs.   Labs:  09/22/33: PTH 34, calcium 9.1, 25-hydroxy vitamin D 58, alkaline phosphatase 54, sodium 134, TSH 1.869, free T4 1.11, free T3 2.4 02/28/13: PTH 70.5, calcium 9.3,  25-hydroxy vitamin D 50, 1,25-dihydroxy vitamin D 64,  09/23/12: PTH 34.6, calcium 9.4, 25-hydroxy vitamin D 49, 1,25-dihydroxy vitamin D 53, TSH 1.767, free T4 1.21, free T3 2.6 03/24/12: PTH 15.3, calcium 9.8, 25-hydroxy vitamin D 56, 1.25-dihydroxy vitamin D 40, TSH 1.254, free T4 1.29, free T3 2.5.   BMD: The patient's bone mineral density study performed recently showed a 2.6% increase of BMD in the spine, but a 9.4% decrease in the wrist.   ASSESSMENT: 1. Osteoporosis: The patient osteoporosis was slightly better in the  spine, but worse in the wrist in April. Since her serum calcium is a bit lower, but still normal, I've asked her to drink an extra glass of milk every day.  I think that if we can increase the calcium intake sufficiently she will not need to resume bisphosphonates.  2. Vitamin D deficiency: Vitamin D levels are currently quite good. 3. Secondary hyperparathyroidism: Her parathyroid hormone levels are very normal. I would like to see her calcium in the upper quartile of the normal frange.   4. Goiter: Thyroid size is stable. She remains euthyroid. 5. 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 I would still like to see it higher over time to provide her adequate amounts of calcium to foster better calcium uptake by bone. Drinking an extra glass of mil daily will help. 6. Hypertension: Her BP is higher again today. It is time to increase the losartan dose.  7. Pedal edema: her edema is minimal today.  8. Right toe pain: She has been having pains in her right great toe for months. Her mother had the same problem. It's time to rule out gout. 9. 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   PLAN: 1. Diagnostic: Will check uric acid today. Will repeat her PTH, calcium, 25-vitamin D, TFTs and CMP at next visit. Check with PCP in about one month for BP.   2. Therapeutic: Increase losartan to a full 50 mg tablet each AM. Try to resume the exercise level she had prior to her diverticulitis. I taught her cervical stretching exercises. 3. Patient education: We discussed the current recommendations from bone mineral density specialists as to calcium and vitamin D intake. I've asked her to try to increase the amount of calcium which she obtains from natural sources, such as milk, but also to take high quality calcium supplements such as Tums, Oscal, and Citracal. We also discussed the issues of hypertension and edema at length.  4. Follow-up: The patient will have a  follow-up appointment in 6 months.  Level of Service: This visit lasted in excess of 50 minutes. More than 50% of the visit was devoted to counseling.  David Stall

## 2013-10-04 NOTE — Patient Instructions (Signed)
Follow up visit in 6 months. Please drink one additional glass of milk every day.

## 2013-10-05 LAB — HM DEXA SCAN

## 2013-10-12 ENCOUNTER — Other Ambulatory Visit: Payer: Self-pay

## 2013-12-13 ENCOUNTER — Ambulatory Visit (INDEPENDENT_AMBULATORY_CARE_PROVIDER_SITE_OTHER): Payer: BC Managed Care – PPO | Admitting: Internal Medicine

## 2013-12-13 ENCOUNTER — Encounter: Payer: Self-pay | Admitting: Internal Medicine

## 2013-12-13 VITALS — BP 138/82 | HR 60 | Temp 97.1°F | Resp 16 | Ht 68.0 in | Wt 151.0 lb

## 2013-12-13 DIAGNOSIS — Z23 Encounter for immunization: Secondary | ICD-10-CM

## 2013-12-13 DIAGNOSIS — I10 Essential (primary) hypertension: Secondary | ICD-10-CM

## 2013-12-13 DIAGNOSIS — R0989 Other specified symptoms and signs involving the circulatory and respiratory systems: Secondary | ICD-10-CM

## 2013-12-13 DIAGNOSIS — R9431 Abnormal electrocardiogram [ECG] [EKG]: Secondary | ICD-10-CM

## 2013-12-13 DIAGNOSIS — E785 Hyperlipidemia, unspecified: Secondary | ICD-10-CM

## 2013-12-13 DIAGNOSIS — R0609 Other forms of dyspnea: Secondary | ICD-10-CM

## 2013-12-13 MED ORDER — ATORVASTATIN CALCIUM 10 MG PO TABS: 10.0000 mg | ORAL_TABLET | Freq: Every day | ORAL | Status: DC

## 2013-12-13 NOTE — Patient Instructions (Signed)

## 2013-12-13 NOTE — Assessment & Plan Note (Signed)
The EKG shows sinus bradycardia with u waves throughout There are no acute changes and no old EKG's for comparison

## 2013-12-13 NOTE — Progress Notes (Signed)
Pre visit review using our clinic review tool, if applicable. No additional management support is needed unless otherwise documented below in the visit note. 

## 2013-12-13 NOTE — Assessment & Plan Note (Signed)
She has a fam hx so I have asked her to have a stress echo done

## 2013-12-13 NOTE — Assessment & Plan Note (Signed)
I think she wound benefit from a statin for primary risk reduction for CAD

## 2013-12-13 NOTE — Assessment & Plan Note (Signed)
Her BP is well controlled 

## 2013-12-13 NOTE — Progress Notes (Signed)
Subjective:    Patient ID: Danielle Carson, female    DOB: 1941/09/16, 73 y.o.   MRN: [id number]  HPI Comments: She returns for f/up and complains of chronic DOE after walking 2-3 miles, she has a Famhx of CAD. She tells me that she had a work up for this years ago at [facility] ( she describes and ECHO and a thallium test ) and she was told that those tests were okay though she does not trust the doctor Dory Peru) who did the tests and we have not been able to get any of her prior records.     Review of Systems  Constitutional: Negative.  Negative for fever, chills, diaphoresis, appetite change, fatigue and unexpected weight change.  HENT: Negative.   Eyes: Negative.   Respiratory: Positive for shortness of breath. Negative for apnea, cough, choking, chest tightness, wheezing and stridor.   Cardiovascular: Negative.  Negative for chest pain, palpitations and leg swelling.  Gastrointestinal: Negative.  Negative for nausea, vomiting, abdominal pain, diarrhea, constipation and blood in stool.  Endocrine: Negative.   Genitourinary: Negative.   Musculoskeletal: Negative.   Skin: Negative.   Allergic/Immunologic: Negative.   Neurological: Negative.  Negative for dizziness, tremors, speech difficulty, weakness, light-headedness and headaches.  Hematological: Negative.  Negative for adenopathy. Does not bruise/bleed easily.  Psychiatric/Behavioral: Negative.        Objective:   Physical Exam  Vitals reviewed. Constitutional: She is oriented to person, place, and time. She appears well-developed and well-nourished. No distress.  HENT:  Head: Normocephalic and atraumatic.  Mouth/Throat: Oropharynx is clear and moist. No oropharyngeal exudate.  Eyes: Conjunctivae are normal. Right eye exhibits no discharge. Left eye exhibits no discharge. No scleral icterus.  Neck: Normal range of motion. Neck supple. No JVD present. No tracheal deviation present. No thyromegaly present.    Cardiovascular: Normal rate, regular rhythm, S1 normal, S2 normal, normal heart sounds and intact distal pulses.  Exam reveals no gallop and no friction rub.   No murmur heard. Pulses:      Carotid pulses are 1+ on the right side, and 1+ on the left side.      Radial pulses are 1+ on the right side, and 1+ on the left side.       Femoral pulses are 1+ on the right side, and 1+ on the left side.      Popliteal pulses are 1+ on the right side, and 1+ on the left side.       Dorsalis pedis pulses are 1+ on the right side, and 1+ on the left side.       Posterior tibial pulses are 1+ on the right side, and 1+ on the left side.  Pulmonary/Chest: Effort normal and breath sounds normal. No stridor. No respiratory distress. She has no wheezes. She has no rales. She exhibits no tenderness.  Abdominal: Soft. Bowel sounds are normal. She exhibits no distension and no mass. There is no tenderness. There is no rebound and no guarding.  Musculoskeletal: Normal range of motion. She exhibits no edema and no tenderness.  Lymphadenopathy:    She has no cervical adenopathy.  Neurological: She is oriented to person, place, and time.  Skin: Skin is warm and dry. No rash noted. She is not diaphoretic. No erythema. No pallor.  Psychiatric: She has a normal mood and affect. Her behavior is normal. Judgment and thought content normal.    Lab Results  Component Value Date   WBC 5.9 06/08/2013   HGB  13.3 06/08/2013   HCT 39.0 06/08/2013   PLT 270.0 06/08/2013   GLUCOSE 113* 09/22/2013   CHOL 184 06/08/2013   TRIG 52.0 06/08/2013   HDL 56.30 06/08/2013   LDLCALC 117* 06/08/2013   ALT 14 09/22/2013   AST 20 09/22/2013   NA 134* 09/22/2013   K 4.4 09/22/2013   CL 99 09/22/2013   CREATININE 0.98 09/22/2013   BUN 15 09/22/2013   CO2 27 09/22/2013   TSH 1.869 09/22/2013   INR 1.59* 11/19/2010   HGBA1C 6.0 06/08/2013        Assessment & Plan:

## 2013-12-14 ENCOUNTER — Telehealth: Payer: Self-pay | Admitting: Internal Medicine

## 2013-12-14 NOTE — Telephone Encounter (Signed)
Relevant patient education assigned to patient using Emmi. ° °

## 2013-12-22 ENCOUNTER — Telehealth: Payer: Self-pay | Admitting: Internal Medicine

## 2013-12-22 ENCOUNTER — Ambulatory Visit (HOSPITAL_COMMUNITY): Payer: BC Managed Care – PPO | Attending: Cardiovascular Disease | Admitting: Radiology

## 2013-12-22 ENCOUNTER — Ambulatory Visit (HOSPITAL_BASED_OUTPATIENT_CLINIC_OR_DEPARTMENT_OTHER): Payer: BC Managed Care – PPO

## 2013-12-22 ENCOUNTER — Encounter: Payer: Self-pay | Admitting: Cardiovascular Disease

## 2013-12-22 DIAGNOSIS — R9431 Abnormal electrocardiogram [ECG] [EKG]: Secondary | ICD-10-CM | POA: Insufficient documentation

## 2013-12-22 DIAGNOSIS — R0989 Other specified symptoms and signs involving the circulatory and respiratory systems: Principal | ICD-10-CM | POA: Insufficient documentation

## 2013-12-22 DIAGNOSIS — R0609 Other forms of dyspnea: Principal | ICD-10-CM

## 2013-12-22 DIAGNOSIS — R0602 Shortness of breath: Secondary | ICD-10-CM

## 2013-12-22 NOTE — Progress Notes (Signed)
Stress Echocardiogram performed.  

## 2013-12-22 NOTE — Telephone Encounter (Signed)
Spoke w/pt to notify her stress echo was not authorized.  I called Deborah [email or web address] office several times yesterday and she said she would get to it in the morning.  As of 9:28 prior still not completed.  Advised pt to call her MD office to have them obtain auth before her test at !pm today. Pt understands and will call.

## 2013-12-24 ENCOUNTER — Encounter: Payer: Self-pay | Admitting: Internal Medicine

## 2014-01-10 ENCOUNTER — Ambulatory Visit (INDEPENDENT_AMBULATORY_CARE_PROVIDER_SITE_OTHER): Payer: BC Managed Care – PPO | Admitting: Internal Medicine

## 2014-01-10 ENCOUNTER — Encounter: Payer: Self-pay | Admitting: Internal Medicine

## 2014-01-10 VITALS — BP 120/81 | HR 60 | Temp 97.5°F | Resp 16 | Ht 68.0 in | Wt 151.0 lb

## 2014-01-10 DIAGNOSIS — L049 Acute lymphadenitis, unspecified: Secondary | ICD-10-CM | POA: Insufficient documentation

## 2014-01-10 MED ORDER — AMOXICILLIN-POT CLAVULANATE 875-125 MG PO TABS: 1.0000 | ORAL_TABLET | Freq: Two times a day (BID) | ORAL | Status: AC

## 2014-01-10 NOTE — Patient Instructions (Signed)
Lymphadenopathy °Lymphadenopathy means "disease of the lymph glands." But the term is usually used to describe swollen or enlarged lymph glands, also called lymph nodes. These are the bean-shaped organs found in many locations including the neck, underarm, and groin. Lymph glands are part of the immune system, which fights infections in your body. Lymphadenopathy can occur in just one area of the body, such as the neck, or it can be generalized, with lymph node enlargement in several areas. The nodes found in the neck are the most common sites of lymphadenopathy. °CAUSES  °When your immune system responds to germs (such as viruses or bacteria ), infection-fighting cells and fluid build up. This causes the glands to grow in size. This is usually not something to worry about. Sometimes, the glands themselves can become infected and inflamed. This is called lymphadenitis. °Enlarged lymph nodes can be caused by many diseases: °· Bacterial disease, such as strep throat or a skin infection. °· Viral disease, such as a common cold. °· Other germs, such as lyme disease, tuberculosis, or sexually transmitted diseases. °· Cancers, such as lymphoma (cancer of the lymphatic system) or leukemia (cancer of the white blood cells). °· Inflammatory diseases such as lupus or rheumatoid arthritis. °· Reactions to medications. °Many of the diseases above are rare, but important. This is why you should see your caregiver if you have lymphadenopathy. °SYMPTOMS  °· Swollen, enlarged lumps in the neck, back of the head or other locations. °· Tenderness. °· Warmth or redness of the skin over the lymph nodes. °· Fever. °DIAGNOSIS  °Enlarged lymph nodes are often near the source of infection. They can help healthcare providers diagnose your illness. For instance:  °· Swollen lymph nodes around the jaw might be caused by an infection in the mouth. °· Enlarged glands in the neck often signal a throat infection. °· Lymph nodes that are swollen  in more than one area often indicate an illness caused by a virus. °Your caregiver most likely will know what is causing your lymphadenopathy after listening to your history and examining you. Blood tests, x-rays or other tests may be needed. If the cause of the enlarged lymph node cannot be found, and it does not go away by itself, then a biopsy may be needed. Your caregiver will discuss this with you. °TREATMENT  °Treatment for your enlarged lymph nodes will depend on the cause. Many times the nodes will shrink to normal size by themselves, with no treatment. Antibiotics or other medicines may be needed for infection. Only take over-the-counter or prescription medicines for pain, discomfort or fever as directed by your caregiver. °HOME CARE INSTRUCTIONS  °Swollen lymph glands usually return to normal when the underlying medical condition goes away. If they persist, contact your health-care provider. He/she might prescribe antibiotics or other treatments, depending on the diagnosis. Take any medications exactly as prescribed. Keep any follow-up appointments made to check on the condition of your enlarged nodes.  °SEEK MEDICAL CARE IF:  °· Swelling lasts for more than two weeks. °· You have symptoms such as weight loss, night sweats, fatigue or fever that does not go away. °· The lymph nodes are hard, seem fixed to the skin or are growing rapidly. °· Skin over the lymph nodes is red and inflamed. This could mean there is an infection. °SEEK IMMEDIATE MEDICAL CARE IF:  °· Fluid starts leaking from the area of the enlarged lymph node. °· You develop a fever of 102° F (38.9° C) or greater. °· Severe   pain develops (not necessarily at the site of a large lymph node). °· You develop chest pain or shortness of breath. °· You develop worsening abdominal pain. °MAKE SURE YOU:  °· Understand these instructions. °· Will watch your condition. °· Will get help right away if you are not doing well or get worse. °Document  Released: 09/01/2008 Document Revised: 02/15/2012 Document Reviewed: 09/01/2008 °[employer]® Patient Information ©2014 [employer]. ° °

## 2014-01-10 NOTE — Assessment & Plan Note (Signed)
This appears to be a bacterial adenitis so I have asked her to start augmentin She was given pt ed material and will try nsaids for the pain

## 2014-01-10 NOTE — Progress Notes (Signed)
Subjective:    Patient ID: Danielle Carson, female    DOB: 1941/03/07, 73 y.o.   MRN: [id number]  HPI  She complains of a 1 week history of painful, swollen lymph node over her left anterior cervical area.  Review of Systems  Constitutional: Negative.  Negative for fever, chills, diaphoresis, appetite change and fatigue.  HENT: Negative.  Negative for congestion, facial swelling, rhinorrhea, sinus pressure, sneezing, sore throat, tinnitus, trouble swallowing and voice change.   Eyes: Negative.   Respiratory: Negative.   Cardiovascular: Negative.  Negative for chest pain, palpitations and leg swelling.  Gastrointestinal: Negative.  Negative for nausea, vomiting, abdominal pain, diarrhea and constipation.  Endocrine: Negative.   Genitourinary: Negative.   Musculoskeletal: Negative.  Negative for arthralgias and myalgias.  Skin: Negative.  Negative for color change, pallor, rash and wound.  Allergic/Immunologic: Negative.   Neurological: Negative.   Hematological: Positive for adenopathy. Does not bruise/bleed easily.  Psychiatric/Behavioral: Negative.        Objective:   Physical Exam  Vitals reviewed. Constitutional: She is oriented to person, place, and time. She appears well-developed and well-nourished.  Non-toxic appearance. She does not have a sickly appearance. She does not appear ill. No distress.  HENT:  Head: Normocephalic and atraumatic.  Mouth/Throat: Oropharynx is clear and moist and mucous membranes are normal. Mucous membranes are not pale, not dry and not cyanotic. No oral lesions. No trismus in the jaw. No uvula swelling. No oropharyngeal exudate, posterior oropharyngeal edema, posterior oropharyngeal erythema or tonsillar abscesses.  Eyes: Conjunctivae are normal. Right eye exhibits no discharge. Left eye exhibits no discharge. No scleral icterus.  Neck: Normal range of motion. Neck supple. No JVD present. No tracheal deviation present. No thyromegaly  present.  Cardiovascular: Normal rate, regular rhythm and intact distal pulses.  Exam reveals no gallop and no friction rub.   No murmur heard. Pulmonary/Chest: Effort normal and breath sounds normal. No stridor. No respiratory distress. She has no wheezes. She has no rales. She exhibits no tenderness.  Abdominal: Soft. Bowel sounds are normal. She exhibits no distension and no mass. There is no tenderness. There is no rebound and no guarding.  Musculoskeletal: Normal range of motion. She exhibits no edema and no tenderness.  Lymphadenopathy:       Head (right side): No submental, no submandibular, no tonsillar, no preauricular, no posterior auricular and no occipital adenopathy present.       Head (left side): No submental, no submandibular, no tonsillar, no preauricular, no posterior auricular and no occipital adenopathy present.    She has cervical adenopathy.       Right cervical: No superficial cervical, no deep cervical and no posterior cervical adenopathy present.      Left cervical: Superficial cervical adenopathy present. No deep cervical and no posterior cervical adenopathy present.    She has no axillary adenopathy.       Right axillary: No pectoral and no lateral adenopathy present.       Left axillary: No pectoral and no lateral adenopathy present.      Right: No inguinal, no supraclavicular and no epitrochlear adenopathy present.       Left: No inguinal, no supraclavicular and no epitrochlear adenopathy present.  Left anterior cervical LN measures about 2 cm, it is freely mobile and mildly tender but it is not fixed and there is no fluctuance or induration.  Neurological: She is oriented to person, place, and time.  Skin: Skin is warm and dry. No rash noted.  She is not diaphoretic. No erythema. No pallor.  Psychiatric: She has a normal mood and affect. Her behavior is normal. Judgment and thought content normal.          Assessment & Plan:

## 2014-01-10 NOTE — Progress Notes (Signed)
Pre visit review using our clinic review tool, if applicable. No additional management support is needed unless otherwise documented below in the visit note. 

## 2014-01-15 ENCOUNTER — Encounter: Payer: Self-pay | Admitting: Internal Medicine

## 2014-01-19 ENCOUNTER — Encounter: Payer: Self-pay | Admitting: Internal Medicine

## 2014-01-19 ENCOUNTER — Other Ambulatory Visit: Payer: Self-pay | Admitting: Internal Medicine

## 2014-01-19 DIAGNOSIS — R221 Localized swelling, mass and lump, neck: Secondary | ICD-10-CM

## 2014-01-19 DIAGNOSIS — L049 Acute lymphadenitis, unspecified: Secondary | ICD-10-CM

## 2014-01-19 DIAGNOSIS — I10 Essential (primary) hypertension: Secondary | ICD-10-CM

## 2014-01-22 ENCOUNTER — Other Ambulatory Visit (INDEPENDENT_AMBULATORY_CARE_PROVIDER_SITE_OTHER): Payer: BC Managed Care – PPO

## 2014-01-22 DIAGNOSIS — I10 Essential (primary) hypertension: Secondary | ICD-10-CM

## 2014-01-22 LAB — CREATININE, SERUM: Creatinine, Ser: 1 mg/dL (ref 0.4–1.2)

## 2014-01-22 LAB — BUN: BUN: 13 mg/dL (ref 6–23)

## 2014-01-24 ENCOUNTER — Other Ambulatory Visit: Payer: BC Managed Care – PPO

## 2014-01-24 ENCOUNTER — Encounter (HOSPITAL_COMMUNITY): Payer: BC Managed Care – PPO | Admitting: Anesthesiology

## 2014-01-24 ENCOUNTER — Emergency Department (HOSPITAL_COMMUNITY): Payer: BC Managed Care – PPO

## 2014-01-24 ENCOUNTER — Ambulatory Visit (HOSPITAL_COMMUNITY)
Admission: EM | Admit: 2014-01-24 | Discharge: 2014-01-24 | Disposition: A | Discharge status: Home / Self Care | Payer: BC Managed Care – PPO | Attending: Emergency Medicine | Admitting: Emergency Medicine

## 2014-01-24 ENCOUNTER — Encounter (HOSPITAL_COMMUNITY): Payer: Self-pay | Admitting: Emergency Medicine

## 2014-01-24 ENCOUNTER — Encounter (HOSPITAL_COMMUNITY)
Admission: EM | Disposition: A | Discharge status: Home / Self Care | Payer: Self-pay | Source: Home / Self Care | Attending: Emergency Medicine

## 2014-01-24 ENCOUNTER — Emergency Department (HOSPITAL_COMMUNITY): Payer: BC Managed Care – PPO | Admitting: Anesthesiology

## 2014-01-24 DIAGNOSIS — S73005A Unspecified dislocation of left hip, initial encounter: Secondary | ICD-10-CM

## 2014-01-24 DIAGNOSIS — X500XXA Overexertion from strenuous movement or load, initial encounter: Secondary | ICD-10-CM | POA: Insufficient documentation

## 2014-01-24 DIAGNOSIS — S73016A Posterior dislocation of unspecified hip, initial encounter: Secondary | ICD-10-CM | POA: Insufficient documentation

## 2014-01-24 HISTORY — PX: HIP CLOSED REDUCTION: SHX983

## 2014-01-24 SURGERY — CLOSED REDUCTION, HIP
Anesthesia: Choice | Laterality: Left

## 2014-01-24 SURGERY — CLOSED REDUCTION, HIP
Anesthesia: General | Site: Hip | Laterality: Left

## 2014-01-24 MED ORDER — SODIUM CHLORIDE 0.9 % IV BOLUS (SEPSIS)
1000.0000 mL | Freq: Once | INTRAVENOUS | Status: AC
  Administered 2014-01-24: 1000 mL via INTRAVENOUS

## 2014-01-24 MED ORDER — PROPOFOL 10 MG/ML IV BOLUS
1.0000 mg/kg | Freq: Once | INTRAVENOUS | Status: DC
  Filled 2014-01-24: qty 1

## 2014-01-24 MED ORDER — LACTATED RINGERS IV SOLN
INTRAVENOUS | Status: DC | PRN
  Administered 2014-01-24: 16:00:00 via INTRAVENOUS

## 2014-01-24 MED ORDER — FENTANYL CITRATE 0.05 MG/ML IJ SOLN
100.0000 ug | Freq: Once | INTRAMUSCULAR | Status: AC
  Administered 2014-01-24: 100 ug via INTRAVENOUS
  Filled 2014-01-24: qty 2

## 2014-01-24 MED ORDER — HYDROCODONE-ACETAMINOPHEN 5-325 MG PO TABS: 1.0000 | ORAL_TABLET | Freq: Four times a day (QID) | ORAL | Status: DC | PRN

## 2014-01-24 MED ORDER — PROPOFOL 10 MG/ML IV BOLUS
INTRAVENOUS | Status: DC | PRN
  Administered 2014-01-24: 90 mg via INTRAVENOUS

## 2014-01-24 MED ORDER — ONDANSETRON HCL 4 MG/2ML IJ SOLN
INTRAMUSCULAR | Status: DC | PRN
  Administered 2014-01-24: 4 mg via INTRAVENOUS

## 2014-01-24 MED ORDER — FENTANYL CITRATE 0.05 MG/ML IJ SOLN
50.0000 ug | Freq: Once | INTRAMUSCULAR | Status: AC
  Administered 2014-01-24: 50 ug via INTRAVENOUS
  Filled 2014-01-24: qty 2

## 2014-01-24 MED ORDER — ONDANSETRON HCL 4 MG/2ML IJ SOLN
4.0000 mg | Freq: Once | INTRAMUSCULAR | Status: AC
  Administered 2014-01-24: 4 mg via INTRAVENOUS
  Filled 2014-01-24: qty 2

## 2014-01-24 MED ORDER — HYDROMORPHONE HCL PF 1 MG/ML IJ SOLN: 0.2500 mg | INTRAMUSCULAR | Status: DC | PRN

## 2014-01-24 MED ORDER — ONDANSETRON HCL 4 MG/2ML IJ SOLN: 4.0000 mg | Freq: Once | INTRAMUSCULAR | Status: DC | PRN

## 2014-01-24 MED ORDER — SUCCINYLCHOLINE CHLORIDE 20 MG/ML IJ SOLN
INTRAMUSCULAR | Status: DC | PRN
  Administered 2014-01-24: 60 mg via INTRAVENOUS

## 2014-01-24 MED ORDER — PROPOFOL 10 MG/ML IV EMUL
INTRAVENOUS | Status: AC | PRN
  Administered 2014-01-24: 70 mL via INTRAVENOUS

## 2014-01-24 MED ORDER — LACTATED RINGERS IV SOLN
INTRAVENOUS | Status: DC
  Administered 2014-01-24: 16:00:00 via INTRAVENOUS

## 2014-01-24 MED ORDER — PROPOFOL 10 MG/ML IV BOLUS
INTRAVENOUS | Status: AC
  Filled 2014-01-24: qty 1

## 2014-01-24 MED ORDER — FENTANYL CITRATE 0.05 MG/ML IJ SOLN
INTRAMUSCULAR | Status: DC | PRN
  Administered 2014-01-24: 50 ug via INTRAVENOUS

## 2014-01-24 MED ORDER — LIDOCAINE HCL (CARDIAC) 20 MG/ML IV SOLN
INTRAVENOUS | Status: DC | PRN
Start: 2014-01-24 — End: 2014-01-24
  Administered 2014-01-24: 50 mg via INTRAVENOUS

## 2014-01-24 MED ORDER — FENTANYL CITRATE 0.05 MG/ML IJ SOLN
INTRAMUSCULAR | Status: AC
  Filled 2014-01-24: qty 5

## 2014-01-24 NOTE — ED Notes (Addendum)
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 No fall, pt has bending over putting shoes on and hip popped out of place. 150 mg of fentanyl and 4 mg zofran in route.

## 2014-01-24 NOTE — Anesthesia Postprocedure Evaluation (Signed)
  Anesthesia Post-op Note  Patient: Danielle Carson  Procedure(s) Performed: Procedure(s): CLOSED REDUCTION HIP (Left)  Patient Location: PACU  Anesthesia Type:General  Level of Consciousness: awake, alert , oriented and patient cooperative  Airway and Oxygen Therapy: Patient Spontanous Breathing  Post-op Pain: mild  Post-op Assessment: Post-op Vital signs reviewed, Patient's Cardiovascular Status Stable, Respiratory Function Stable, Patent Airway, No signs of Nausea or vomiting and Pain level controlled  Post-op Vital Signs: stable  Complications: No apparent anesthesia complications

## 2014-01-24 NOTE — Discharge Instructions (Signed)
Hip Dislocation  Hip dislocation is the displacement of the "ball" at the head of your thigh bone (femur) from its socket in the hip bone (pelvis). The ball-and-socket structure of the hip joint gives it a lot of stability, while allowing it to move freely. Therefore, a lot of force is required to displace the femur from its socket. A hip dislocation is an emergency. If you believe you have dislocated your hip and cannot move your leg, call for help immediately. Do not try to move.  CAUSES  The most common cause of hip dislocation is motor vehicle accidents. However, force from falls from a height (a ladder or building), injuries from contact sports, or injuries from industrial accidents can be enough to dislocate your hip.  SYMPTOMS  A hip dislocation is very painful. If you have a dislocated hip, you will not be able to move your hip. If you have nerve damage, you may not have feeling in your lower leg, foot, or ankle.   DIAGNOSIS  Usually, your caregiver can diagnose a hip dislocation by looking at the position of your leg. Generally, X-ray exams are done to check for fractures in your femur or pelvis. The leg of the dislocated hip will appear shorter than the other leg, and your foot will be turned inward.  TREATMENT   Your caregiver can manipulate your bones back into the joint (reduction). If there are no other complications involved with your dislocation, such as fractures or damage to blood vessels or nerves, this procedure can be done without surgery. Before this procedure, you will be given medicine so that you will not feel pain (anesthetic). Often specialized imaging exams are done after the reduction (magnetic resonance imaging [MRI] or computed tomography [CT]) to check for loose pieces of cartilage or bone in the joint.  If a manual reduction fails or you have nerve damage, damage to your blood vessels, or bone fractures, surgery will be necessary to perform the reduction.   HOME CARE  INSTRUCTIONS  The following measures can help to reduce pain and speed up the healing process:  · Rest your injured joint. Do not move your joint if it is painful. Also, avoid activities similar to the one that caused your injury.  · Apply ice to your injured joint for 1 to 2 days after your reduction or as directed by your caregiver. Applying ice helps to reduce inflammation and pain.  · Put ice in a plastic bag.  · Place a towel between your skin and the bag.  · Leave the ice on for 15 to 20 minutes at a time, every 2 hours while you are awake.  · Use crutches or a walker as directed by your caregiver.  · Exercise your hip and leg as directed by your caregiver.  · Take over-the-counter or prescription medicine for pain as directed by your caregiver.  SEEK IMMEDIATE MEDICAL CARE IF:  · Your pain becomes worse rather than better.  · You feel like your hip has become dislocated again.  MAKE SURE YOU:  · Understand these instructions.  · Will watch your condition.  · Will get help right away if you are not doing well or get worse.  Document Released: 08/18/2001 Document Revised: 02/15/2012 Document Reviewed: 04/23/2011  [employer]® Patient Information ©2014 [employer].

## 2014-01-24 NOTE — ED Provider Notes (Signed)
CSN: [id number]     Arrival date & time 01/24/14  1030 History   First MD Initiated Contact with Patient 01/24/14 1032     Chief Complaint  Patient presents with  . 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 She's had a prosthesis placed several years ago and has had 2 prior hip dislocations. The patient was sitting down leaning over to put on her shoe and had immediate pain and the like her hip popped out. She's given 150 mcg of fentanyl just prior to arrival by EMS and feels significantly improved but still feels a lot of pressure in her left hip. Patient declines wanting pain medicine this time. There is no numbness, weakness or discoloration of her foot. No other acute issues or injury. Her orthopedist is Dr. Turner Danielsowan.   Past Medical History  Diagnosis Date  . Coronary artery disease   . Hiatal hernia   . Diverticulosis   . Trapezius muscle spasm 10/04/2013   Past Surgical History  Procedure Laterality Date  . Joint replacement      bilat hip replacements and left hip revision  . Hip closed reduction  03/17/2012    Procedure: CLOSED MANIPULATION HIP;  Surgeon: Nestor LewandowskyFrank J Rowan, MD;  Location: [facility];  Service: Orthopedics;  Laterality: Left;   Family History  Problem Relation Age of Onset  . Cardiomyopathy Mother   . Osteoarthritis Mother   . Cardiomyopathy Sister   . Osteoarthritis Sister   . Heart disease Sister   . Heart disease Father   . Stroke Brother   . Cancer Neg Hx   . Alcohol abuse Neg Hx   . COPD Neg Hx   . Depression Neg Hx   . Diabetes Neg Hx   . Drug abuse Neg Hx   . Early death Neg Hx   . Hearing loss Neg Hx   . Hyperlipidemia Neg Hx   . Hypertension Neg Hx   . Kidney disease Neg Hx    History  Substance Use Topics  . Smoking status: Never Smoker   . Smokeless tobacco:  Never Used  . Alcohol Use: 3.0 oz/week    5 Glasses of wine per week   OB History   Grav Para Term Preterm Abortions TAB SAB Ect Mult Living                 Review of Systems  Gastrointestinal: Negative for vomiting.  Musculoskeletal: Negative for joint swelling.       Left hip pain  Skin: Negative for color change and wound.  Neurological: Negative for weakness and numbness.  All other systems reviewed and are negative.      Allergies  Review of patient's allergies indicates no known allergies.  Home Medications   Current Outpatient Rx  Name  Route  Sig  Dispense  Refill  . amoxicillin-clavulanate (AUGMENTIN) 875-125 MG per tablet   Oral   Take 1 tablet by mouth 2 (two) times daily.   28 tablet   0   . aspirin 81 MG tablet   Oral   Take 81 mg by mouth daily.         Marland Kitchen. atorvastatin (LIPITOR) 10 MG tablet   Oral   Take 1 tablet (10 mg total) by mouth daily.   90 tablet   3   . B Complex Vitamins (VITAMIN-B COMPLEX PO)   Oral  Take 1 capsule by mouth daily.         . calcium-vitamin D (OSCAL WITH D) 500-200 MG-UNIT per tablet   Oral   Take 1 tablet by mouth 2 (two) times daily.           Marland Kitchen glucosamine-chondroitin 500-400 MG tablet   Oral   Take 1 tablet by mouth 3 (three) times daily. OTC          . losartan (COZAAR) 50 MG tablet   Oral   Take 1 tablet (50 mg total) by mouth daily.   30 tablet   6   . Multiple Vitamin (MULTIVITAMIN) tablet   Oral   Take 1 tablet by mouth daily.           . Probiotic Product (SOLUBLE FIBER/PROBIOTICS PO)   Oral   Take by mouth.         . Vitamin A 09604 UNITS TABS   Oral   Take by mouth.          BP 144/56  Pulse 90  Temp(Src) 98 F (36.7 C) (Oral)  Resp 20  SpO2 98% Physical Exam  Nursing note and vitals reviewed. Constitutional: She is oriented to person, place, and time. She appears well-developed and well-nourished. No distress.  Appears uncomfortable  HENT:  Head: Normocephalic and  atraumatic.  Right Ear: External ear normal.  Left Ear: External ear normal.  Nose: Nose normal.  Eyes: Right eye exhibits no discharge. Left eye exhibits no discharge.  Cardiovascular: Normal rate, regular rhythm and normal heart sounds.   Pulses:      Dorsalis pedis pulses are 2+ on the right side, and 2+ on the left side.  Pulmonary/Chest: Effort normal.  Abdominal: She exhibits no distension.  Musculoskeletal:       Left hip: She exhibits tenderness and deformity (shortened extremity).  NV intact in distal lower extremities  Neurological: She is alert and oriented to person, place, and time.  Skin: Skin is warm and dry.    ED Course  Procedural sedation Date/Time: 01/24/2014 7:01 PM Performed by: Pricilla Loveless T Authorized by: Pricilla Loveless T Consent: Verbal consent obtained. written consent obtained. Risks and benefits: risks, benefits and alternatives were discussed Consent given by: patient Time out: Immediately prior to procedure a "time out" was called to verify the correct patient, procedure, equipment, support staff and site/side marked as required. Preparation: Patient was prepped and draped in the usual sterile fashion. Local anesthesia used: no Patient sedated: yes Sedatives: etomidate and propofol Analgesia: morphine and fentanyl Vitals: Vital signs were monitored during sedation. Patient tolerance: Patient tolerated the procedure well with no immediate complications.  Reduction of dislocation Date/Time: 01/24/2014 7:02 PM Performed by: Pricilla Loveless T Authorized by: Pricilla Loveless T Consent: Verbal consent obtained. written consent obtained. Risks and benefits: risks, benefits and alternatives were discussed Consent given by: patient Time out: Immediately prior to procedure a "time out" was called to verify the correct patient, procedure, equipment, support staff and site/side marked as required. Preparation: Patient was prepped and draped in the usual  sterile fashion. Patient sedated: yes Patient tolerance: Patient tolerated the procedure well with no immediate complications. Comments: Multiple attempts at left hip dislocation reduction without success. Remains NV intact after reduction attempts.    (including critical care time) Labs Review Labs Reviewed - No data to display Imaging Review Dg Hip Portable 1 View Left  01/24/2014   CLINICAL DATA:  Dislocation  EXAM: PORTABLE LEFT HIP - 1 VIEW  COMPARISON:  DG HIP PORTABLE 1 VIEW*L* dated 03/17/2012  FINDINGS: Left total hip arthroplasty is dislocated. No breakage or loosening of the hardware. No fracture.  IMPRESSION: Dislocated left total hip arthroplasty.   Electronically Signed   By: Maryclare Bean M.D.   On: 01/24/2014 11:05    EKG Interpretation   None       MDM   Final diagnoses:  Hip dislocation, left    Hip dislocated, as above. NV intact. D/w Dr. Wadie Lessen PA, who requests ED attempt at reduction. After conscious sedation with propofol, I am unable to reduce despite patient being relaxed. Ortho reconsulted, PA evaluated and requests transfer to [facility] for OR reduction.    Audree Camel, MD 01/24/14 [phone]

## 2014-01-24 NOTE — ED Notes (Signed)
Bed: [id number] Expected date:  Expected time:  Means of arrival:  Comments: Hip replacement/dislocation

## 2014-01-24 NOTE — ED Notes (Signed)
Initial Contact - Pt prepped for conscious sedation, pt placed to cardiac/02 monitor, 02 placed, [facility].  Family at bs.  Awaiting EDP.

## 2014-01-24 NOTE — Op Note (Signed)
Pre Op Dx: Posterior dislocation left total hip  Post Op Dx: Same  Procedure: Closed reduction under general anesthetic  Surgeon: Nestor LewandowskyFrank J Pamila Mendibles, MD  Assistant: Tomi LikensEric K. Gaylene BrooksPhillips PA-C  (present throughout entire procedure and necessary for timely completion of the procedure)  Anesthesia: General  EBL: 0  Fluids: 500 cc  Indications: Status post index left total hip in 1993 had revision of polyethylene liners 5 or 6 years ago with a couple of dislocations after the polyethylene liner had been revised. Did well since 2011. Was bending forward putting on a slip on shoe today sitting on the edge of her bed and sustained a posterior dislocation. Transported was a long emergency room attempted close reduction by the ER staff under sedation was not successful and she is taken for closed reduction under general anesthesia possible open reduction.  Procedure: Patient identified by arm band and taken to operating room 12 progress that monitors were attached and general mask anesthesia was induced with the patient in the hospital bed. She received IV paralytic medication. The hip was flexed between 60 and 90 of internal maximal rotation and close reduction was accomplished and confirmed by C-arm imaging. She remained neurovascularly intact. She is awaken and transported to the PACU where a hinge abduction brace from 5 years ago was applied. The plan is to let her go home today.

## 2014-01-24 NOTE — Preoperative (Signed)
Beta Blockers   Reason not to administer Beta Blockers:Not Applicable 

## 2014-01-24 NOTE — Transfer of Care (Signed)
Immediate Anesthesia Transfer of Care Note  Patient: Danielle Carson  Procedure(s) Performed: Procedure(s): CLOSED REDUCTION HIP (Left)  Patient Location: PACU  Anesthesia Type:General  Level of Consciousness: awake, alert  and oriented  Airway & Oxygen Therapy: Patient Spontanous Breathing and Patient connected to nasal cannula oxygen  Post-op Assessment: Report given to PACU RN and Post -op Vital signs reviewed and stable  Post vital signs: Reviewed and stable  Complications: No apparent anesthesia complications

## 2014-01-24 NOTE — H&P (Signed)
Danielle Carson is an 73 y.o. female.   Chief Complaint: Left hip pain  HPI:   Patient States that she was bending over to tie her shoes when she noticed severe left hip pain.  [facility] this [facility] with severe Left hip pain.  X rays were obtained showing a left hip dislocation.  The pt is status post left total hip arthroplasty and has had 2 previous dislocations.  Unfortunately, the hip was unable to be close reduced in the ER through sedation.     Past Medical History  Diagnosis Date  . Coronary artery disease   . Hiatal hernia   . Diverticulosis   . Trapezius muscle spasm 10/04/2013    Past Surgical History  Procedure Laterality Date  . Joint replacement      bilat hip replacements and left hip revision  . Hip closed reduction  03/17/2012    Procedure: CLOSED MANIPULATION HIP;  Surgeon: Nestor LewandowskyFrank J Genesis Novosad, MD;  Location: [facility];  Service: Orthopedics;  Laterality: Left;    Family History  Problem Relation Age of Onset  . Cardiomyopathy Mother   . Osteoarthritis Mother   . Cardiomyopathy Sister   . Osteoarthritis Sister   . Heart disease Sister   . Heart disease Father   . Stroke Brother   . Cancer Neg Hx   . Alcohol abuse Neg Hx   . COPD Neg Hx   . Depression Neg Hx   . Diabetes Neg Hx   . Drug abuse Neg Hx   . Early death Neg Hx   . Hearing loss Neg Hx   . Hyperlipidemia Neg Hx   . Hypertension Neg Hx   . Kidney disease Neg Hx    Social History:  reports that she has never smoked. She has never used smokeless tobacco. She reports that she drinks about 3.0 ounces of alcohol per week. She reports that she does not use illicit drugs.  Allergies: No Known Allergies   (Not in a hospital admission)  No results found for this or any previous visit (from the past 48 hour(s)). Dg Hip Portable 1 View Left  01/24/2014   CLINICAL DATA:  Dislocation  EXAM: PORTABLE LEFT HIP - 1 VIEW  COMPARISON:  DG HIP PORTABLE 1 VIEW*L* dated  03/17/2012  FINDINGS: Left total hip arthroplasty is dislocated. No breakage or loosening of the hardware. No fracture.  IMPRESSION: Dislocated left total hip arthroplasty.   Electronically Signed   By: Maryclare BeanArt  Hoss M.D.   On: 01/24/2014 11:05    Review of Systems  Constitutional: Negative.   HENT: Negative.   Eyes: Negative.   Cardiovascular: Negative.   Genitourinary: Negative.   Musculoskeletal: Positive for joint pain.  Skin: Negative.   Neurological: Negative.   Endo/Heme/Allergies: Negative.   Psychiatric/Behavioral: Negative.     Blood pressure 153/73, pulse 99, temperature 98 F (36.7 C), temperature source Oral, resp. rate 17, SpO2 100.00%. Physical Exam  Constitutional: She is oriented to person, place, and time. She appears well-developed and well-nourished.  HENT:  Head: Normocephalic and atraumatic.  Neck: Normal range of motion. Neck supple.  Cardiovascular: Intact distal pulses.   Respiratory: Effort normal.  Musculoskeletal:  Sever pain left hip with palpations.  Typical presentation with left leg laying in external rotation.  Neurological: She is alert and oriented to person, place, and time.  Skin: Skin is warm and dry.  Psychiatric: She has a normal mood and affect. Her behavior is normal. Judgment and  thought content normal.     Assessment/Plan Left Hip Dislocation in a patient with 2 previous dislocations of her left total hip.  Plan:  This Patient is discussed with Dr. [name].  There is time available in the [facility] OR to relocate the Pt's left  Hip.  The plan is to transport the patient through [facility] to [facility].  We will sedate the patient and relocate the hip under gentle traction in the OR.    PHILLIPS, ERIC R 01/24/2014, 1:23 PM

## 2014-01-24 NOTE — Interval H&P Note (Signed)
History and Physical Interval Note:  01/24/2014 3:23 PM  Danielle Carson  has presented today for surgery, with the diagnosis of Dislocation of Left Hip  The various methods of treatment have been discussed with the patient and family. After consideration of risks, benefits and other options for treatment, the patient has consented to  Procedure(s): CLOSED REDUCTION HIP (Left) as a surgical intervention .  The patient's history has been reviewed, patient examined, no change in status, stable for surgery.  I have reviewed the patient's chart and labs.  Questions were answered to the patient's satisfaction.     [name],[name]

## 2014-01-24 NOTE — Anesthesia Preprocedure Evaluation (Addendum)
Anesthesia Evaluation  Patient identified by MRN, date of birth, ID band Patient awake    Reviewed: Allergy & Precautions, H&P , NPO status , Patient's Chart, lab work & pertinent test results  Airway       Dental   Pulmonary shortness of breath,          Cardiovascular hypertension, + CAD and + DOE     Neuro/Psych    GI/Hepatic hiatal hernia,   Endo/Other    Renal/GU      Musculoskeletal   Abdominal   Peds  Hematology   Anesthesia Other Findings   Reproductive/Obstetrics                          Anesthesia Physical Anesthesia Plan  ASA: III  Anesthesia Plan: General   Post-op Pain Management:    Induction: Intravenous  Airway Management Planned: Mask  Additional Equipment:   Intra-op Plan:   Post-operative Plan:   Informed Consent: I have reviewed the patients History and Physical, chart, labs and discussed the procedure including the risks, benefits and alternatives for the proposed anesthesia with the patient or authorized representative who has indicated his/her understanding and acceptance.     Plan Discussed with:   Anesthesia Plan Comments:         Anesthesia Quick Evaluation

## 2014-01-26 ENCOUNTER — Ambulatory Visit (INDEPENDENT_AMBULATORY_CARE_PROVIDER_SITE_OTHER)
Admission: RE | Admit: 2014-01-26 | Discharge: 2014-01-26 | Disposition: A | Discharge status: Home / Self Care | Payer: BC Managed Care – PPO | Source: Ambulatory Visit | Attending: Internal Medicine | Admitting: Internal Medicine

## 2014-01-26 DIAGNOSIS — R220 Localized swelling, mass and lump, head: Secondary | ICD-10-CM

## 2014-01-26 DIAGNOSIS — R221 Localized swelling, mass and lump, neck: Secondary | ICD-10-CM

## 2014-01-26 DIAGNOSIS — L049 Acute lymphadenitis, unspecified: Secondary | ICD-10-CM

## 2014-01-26 MED ORDER — IOHEXOL 300 MG/ML  SOLN
75.0000 mL | Freq: Once | INTRAMUSCULAR | Status: AC | PRN
  Administered 2014-01-26: 75 mL via INTRAVENOUS

## 2014-01-28 ENCOUNTER — Encounter: Payer: Self-pay | Admitting: Internal Medicine

## 2014-03-21 ENCOUNTER — Other Ambulatory Visit: Payer: Self-pay | Admitting: *Deleted

## 2014-03-21 DIAGNOSIS — M810 Age-related osteoporosis without current pathological fracture: Secondary | ICD-10-CM

## 2014-03-30 ENCOUNTER — Other Ambulatory Visit: Payer: BC Managed Care – PPO

## 2014-04-03 LAB — COMPREHENSIVE METABOLIC PANEL
ALBUMIN: 4.4 g/dL (ref 3.5–5.2)
ALT: 16 U/L (ref 0–35)
AST: 21 U/L (ref 0–37)
Alkaline Phosphatase: 61 U/L (ref 39–117)
BUN: 19 mg/dL (ref 6–23)
CALCIUM: 9.3 mg/dL (ref 8.4–10.5)
CHLORIDE: 96 meq/L (ref 96–112)
CO2: 27 meq/L (ref 19–32)
Creat: 0.94 mg/dL (ref 0.50–1.10)
Glucose, Bld: 85 mg/dL (ref 70–99)
POTASSIUM: 4.5 meq/L (ref 3.5–5.3)
Sodium: 134 mEq/L — ABNORMAL LOW (ref 135–145)
Total Bilirubin: 0.5 mg/dL (ref 0.2–1.2)
Total Protein: 7.1 g/dL (ref 6.0–8.3)

## 2014-04-03 LAB — TSH: TSH: 3.197 u[IU]/mL (ref 0.350–4.500)

## 2014-04-03 LAB — VITAMIN D 25 HYDROXY (VIT D DEFICIENCY, FRACTURES): VIT D 25 HYDROXY: 54 ng/mL (ref 30–89)

## 2014-04-03 LAB — T4, FREE: Free T4: 1.05 ng/dL (ref 0.80–1.80)

## 2014-04-03 LAB — PTH, INTACT AND CALCIUM
CALCIUM: 9.3 mg/dL (ref 8.4–10.5)
PTH: 43.8 pg/mL (ref 14.0–72.0)

## 2014-04-03 LAB — T3, FREE: T3, Free: 2.4 pg/mL (ref 2.3–4.2)

## 2014-04-03 LAB — CALCIUM: Calcium: 9.3 mg/dL (ref 8.4–10.5)

## 2014-04-04 ENCOUNTER — Ambulatory Visit (INDEPENDENT_AMBULATORY_CARE_PROVIDER_SITE_OTHER): Payer: BC Managed Care – PPO | Admitting: "Endocrinology

## 2014-04-04 ENCOUNTER — Encounter: Payer: Self-pay | Admitting: "Endocrinology

## 2014-04-04 VITALS — BP 136/77 | HR 65 | Wt 153.6 lb

## 2014-04-04 DIAGNOSIS — R946 Abnormal results of thyroid function studies: Secondary | ICD-10-CM

## 2014-04-04 DIAGNOSIS — R7989 Other specified abnormal findings of blood chemistry: Secondary | ICD-10-CM

## 2014-04-04 DIAGNOSIS — E559 Vitamin D deficiency, unspecified: Secondary | ICD-10-CM

## 2014-04-04 DIAGNOSIS — I10 Essential (primary) hypertension: Secondary | ICD-10-CM

## 2014-04-04 DIAGNOSIS — E211 Secondary hyperparathyroidism, not elsewhere classified: Secondary | ICD-10-CM

## 2014-04-04 DIAGNOSIS — R220 Localized swelling, mass and lump, head: Secondary | ICD-10-CM

## 2014-04-04 DIAGNOSIS — R221 Localized swelling, mass and lump, neck: Secondary | ICD-10-CM

## 2014-04-04 DIAGNOSIS — E049 Nontoxic goiter, unspecified: Secondary | ICD-10-CM

## 2014-04-04 DIAGNOSIS — M810 Age-related osteoporosis without current pathological fracture: Secondary | ICD-10-CM

## 2014-04-04 NOTE — Progress Notes (Signed)
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 She was unaccompanied for today's visit.   1. The patient was referred to me on 07/15/2005 for evaluation of osteoporosis by her former primary care provider, Dr. [name], M.D.   A. The patient developed bilateral hip joint pains in the late 1990s. In 1996, Dr. [name] of [facility], ordered a bone mineral density study. That study showed significant osteoporosis in the spine and in both hips.  The patient was treated with calcium carbonate and with a multivitamin that included vitamin D. Despite taking these medications and having a reasonably robust exercise regimen, the patient's osteoporosis worsened. In 1999 Dr. [name]  performed bilateral hip replacements. Treatment with Fosamax was begun at about that time. In the intervening years bone mineral density had not changed significantly. She was treated with Boniva for several months, but just forgot to take it. Her past medical history revealed that she had had arthritis in many joints for many years. She had undergone natural menopause about 20 years prior. She did take estrogen for one or two years, then stopped that medication. Her surgeries included bilateral hip replacements and bilateral vein stripping. 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 Family history revealed that 5 of her 6 siblings had osteoporosis. Two of those had celiac disease when younger. A sister and brother had thyroid problems as well. [Addendum 04/04/13: By now all 6 of her siblings are hypothyroid and take thyroid hormone. One brother became hypothyroid after radioactive iodine therapy. A niece has been diagnosed with Graves' disease.]  B. Her physical examination was completely  unremarkable. Laboratory data revealed that her PTH was 75.5, which was at the upper limit of normal. Her calcium was 8.7, which was in the lower 15% of the normal range. Her 25-hydroxy vitamin D was 39 which was normal. Her 1,25 vitamin D was 51 which was also normal. I felt that she had relative hyperparathyroidism due to relatively low calcium and vitamin D intake. I asked her to increase her calcium and vitamin D intake. On subsequent testing on 09/30/2005, her PTH was 37.0, calcium 9.3, 25-vitamin D 47, and 1,25-vitamin D 55.   2. During the past nine years the patient has done well overall. Because it was unclear how long she should remain on Fosamax treatment, I recommended that she obtain a second opinion from Dr. Gwendolyn Grant at [facility]. She saw Dr. Valarie Cones in June of 2009. He recommended that she not take either Fosamax or Boniva treatments. He felt that she had derived all of the benefit from Fosamax that she could. He recommended that she continue her calcium, vitamin D, and exercise. Since then she has had a left hip revision procedure performed.  She continued to have follow-up visits with Dr. [name] in orthopedics. A bone mineral density study performed on 02/16/2011 showed that her bone mineral density probably declined about 2.4-3.7% in the last 3 years. Since that study she has also had two hip dislocation procedures performed.  3.The patient's last [facility] visit was on 10/04/13. In the interim she dislocated her left hip again, the third time in 4 years. None of the dislocations were traumatic. She is due for repeat hip surgery in July. She has nor had any further diverticulitis. She has a new lump in her  left anterior neck. CT scan in February showed that this is the left submandibular gland. She tolerates her losartan very well. She remains on calcium with vitamin D twice daily (600 mg of Caltrate D twice daily). She has not had much leg edema recently.  Otherwise she has been pretty healthy and has been doing well. She walks regularly without any limitation.  4. Pertinent Review of Systems: Constitutional: The patient feels well, is healthy, and has no significant complaints. She does have frequent trapezius and nuchal tension, especially when she spends a lot of time at her computer..  Eyes: Vision is good with her glasses. There are no significant eye complaints. Neck: The patient has no other complaints of anterior neck swelling, soreness, tenderness,  pressure, discomfort, or difficulty swallowing.  Heart: Heart rate increases with exercise or other physical activity. The patient has no complaints of palpitations, irregular heat beats, chest pain, or chest pressure. Gastrointestinal: She still takes a probiotic to control her reflux. Bowel movents seem normal. The patient has no complaints of excessive hunger, acid reflux, upset stomach, stomach aches or pains, diarrhea, or constipation. Legs: Muscle mass and strength seem normal. There are no complaints of numbness, tingling, or burning. She has occasional leg muscle pains after walking. She has more frequent edema if she sits too long.  Feet: There are no obvious foot problems, except for arthritis in her toes. There are no complaints of numbness, tingling, burning, or pain. No edema is noted.  PAST MEDICAL, FAMILY, AND SOCIAL HISTORY: 1. Work and family: Her son is a Midwif[employer]onel in the [employer] who is now [place] in [place]. He will retire next year. 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 She thinks that her other 6 siblings are all hypothyroid and take thyroid hormone. One brother had may have had radioiodine treatment. A niece has been diagnosed with Graves' disease.  2. Activities: She is active physically. She tries to walk about three times per week. She also uses a stair-stepper frequently.   3. Primary care provider: She now sees Dr. Etta Grandchildhomas L. Jones at [facility].  REVIEW  OF SYSTEMS: There are no other significant problems involving her other body systems.  PHYSICAL EXAM: BP 136/77  Pulse 65  Wt 153 lb 9.6 oz (69.673 kg)   Constitutional: The patient looks healthy, physically and emotionally well, but somewhat slimmer. She has lost another 6 pounds since her last visit. She is alert, bright, and very mentally sharp. Eyes: There is no arcus or proptosis.  Mouth: The oropharynx appears normal. The tongue appears normal. There is normal oral moisture. There is no obvious gingivitis. Neck: There are no bruits present. The thyroid gland appears normal in size. The thyroid gland is normal at 18-20 grams in size. The consistency of the thyroid gland is normal. There is no thyroid tenderness to palpation. Her trapezius muscles are tight and tender. Her nuchal cords are tight.  The left submandibular gland is somewhat larger and firmer than the left. When examined from the front, the left gland appears much larger than the right gland. When examined from the back, however, the left gland is larger, but not dramatically so. These glands are not tender to palpation. Lungs: The lungs are clear. Air movement is good. Heart: The heart rhythm and rate appear normal. Heart sounds S1 and S2 are normal. I do not appreciate any pathologic heart murmurs. Abdomen: The abdomen is normal in size. Bowel sounds are normal. The abdomen is soft  and non-tender. There is no obviously palpable hepatomegaly, splenomegaly, or other masses.  Arms: Muscle mass appears appropriate for age.  Hands: There is no obvious tremor. Phalangeal and metacarpophalangeal joints appear normal. Palms are normal. Legs: Muscle mass appears appropriate for age. There is trace edema bilaterally.  Neurologic: Muscle strength is normal for age and gender  in both the upper and the lower extremities. Muscle tone appears normal. Sensation to touch is normal in the legs.   Labs:   Labs 04/02/14: PTH 43.8, calcium 9.3,  25-hydroxy vitamin D 54, sodium 134; TSH 3.197, free T4 1.05, free T3 2.4  Labs 09/22/33: PTH 34, calcium 9.1, 25-hydroxy vitamin D 58, alkaline phosphatase 54, sodium 134, TSH 1.869, free T4 1.11, free T3 2.4  Labs 02/28/13: PTH 70.5, calcium 9.3, 25-hydroxy vitamin D 50, 1,25-dihydroxy vitamin D 64,   Labs 09/23/12: PTH 34.6, calcium 9.4, 25-hydroxy vitamin D 49, 1,25-dihydroxy vitamin D 53, TSH 1.767, free T4 1.21, free T3 2.6  Labs 03/24/12: PTH 15.3, calcium 9.8, 25-hydroxy vitamin D 56, 1.25-dihydroxy vitamin D 40, TSH 1.254, free T4 1.29, free T3 2.5.   03/29/13 Dexa scan: The patient's bone mineral density study performed showed a 2.6% increase of BMD in the spine, but a 9.4% decrease in the wrist.   ASSESSMENT: 1. Osteoporosis: The patient's osteoporosis was slightly better in the spine, but worse in the wrist in April 2014. Since her serum calcium is a bit lower, but still normal, I've asked her to drink an extra glass of milk every day.  I think that if we can increase the calcium intake sufficiently she will not need to resume bisphosphonates. We do not want her PTH to pull calcium out of bone.  2. Vitamin D deficiency: Vitamin D levels are currently quite good. 3. Secondary hyperparathyroidism: Her parathyroid hormone levels are normal, but higher..I would like to see her calcium in the upper quartile of the normal frange and her PTH in the lower quartile of the normal range. .   4. Goiter: Thyroid size is stable. She remains euthyroid. 5. Hypocalcemia: Her serum calcium has increased slightly, but is still below the 50%. I would still like to see it higher over time to provide her adequate amounts of calcium to foster better calcium uptake by bone. Drinking an extra glass of milk daily will help. 6. Hypertension: Her BP is fine on her current losartan dose.  7. Pedal edema: She has no edema today.  8. Toe pain: resolved 9. 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 Massage  therapy helps. 10. TSH elevation: Given her FH of autoimmune thyroid disease it is likely that she is slowly developing Hashimoto's thyroiditis. It is also likely that she will eventually need to be treated with thyroid hormone. 11. Neck lump: The CT scan indicated that the lump in the left anterior aspect of her neck is an enlarged submandibular gland. I would like to obtain an US to be able to follow this mass yet avoid excessive radiation.   PLAN: 1. Diagnostic: Will repeat her PTH, calcium, 25-vitamin D, TFTs and CMP at next visit. Obtain US of the neck to assess the submandibular glands.    2. Therapeutic: Continue losartan at 50 mg tablet each AM. Continue to exercise.. 3. Patient education: We discussed the current recommendations from bone mineral density specialists as to calcium and vitamin D intake. I've asked her to try to increase the amount of calcium which she obtains from natural sources, such as  milk, but also to take high quality calcium supplements such as Tums, Oscal, and Citracal. We also discussed the issues of hypertension and edema at length.  4. Follow-up: The patient will have a follow-up appointment in 6 months.  Level of Service: This visit lasted in excess of 50 minutes. More than 50% of the visit was devoted to counseling.  David Stall

## 2014-04-04 NOTE — Patient Instructions (Signed)
Follow up visit in 6 months. Please have lab tests drawn 1-2 weeks prior to next visit.

## 2014-04-05 DIAGNOSIS — R7989 Other specified abnormal findings of blood chemistry: Secondary | ICD-10-CM | POA: Insufficient documentation

## 2014-04-11 ENCOUNTER — Ambulatory Visit: Payer: BC Managed Care – PPO | Admitting: Internal Medicine

## 2014-04-12 ENCOUNTER — Encounter: Payer: Self-pay | Admitting: Internal Medicine

## 2014-04-12 ENCOUNTER — Ambulatory Visit (INDEPENDENT_AMBULATORY_CARE_PROVIDER_SITE_OTHER): Payer: BC Managed Care – PPO | Admitting: Internal Medicine

## 2014-04-12 ENCOUNTER — Other Ambulatory Visit: Payer: BC Managed Care – PPO

## 2014-04-12 VITALS — BP 140/70 | HR 54 | Temp 97.1°F | Resp 16 | Ht 68.0 in | Wt 152.8 lb

## 2014-04-12 DIAGNOSIS — I10 Essential (primary) hypertension: Secondary | ICD-10-CM

## 2014-04-12 DIAGNOSIS — E785 Hyperlipidemia, unspecified: Secondary | ICD-10-CM

## 2014-04-12 NOTE — Progress Notes (Signed)
Pre visit review using our clinic review tool, if applicable. No additional management support is needed unless otherwise documented below in the visit note. 

## 2014-04-12 NOTE — Progress Notes (Signed)
   Subjective:    Patient ID: Danielle Boroughsatricia Fairfield-Artman, female    DOB: [birth year], 73 y.o.   MRN: [id number]  Hypertension This is a chronic problem. The current episode started more than 1 year ago. The problem is unchanged. The problem is controlled. Pertinent negatives include no anxiety, blurred vision, chest pain, headaches, malaise/fatigue, neck pain, orthopnea, palpitations, peripheral edema, PND, shortness of breath or sweats. There are no associated agents to hypertension. Past treatments include lifestyle changes and angiotensin blockers. The current treatment provides moderate improvement. There are no compliance problems.       Review of Systems  Constitutional: Negative.  Negative for fever, chills, malaise/fatigue, diaphoresis, appetite change and fatigue.  HENT: Negative.   Eyes: Negative.  Negative for blurred vision.  Respiratory: Negative.  Negative for choking, chest tightness, shortness of breath and stridor.   Cardiovascular: Negative.  Negative for chest pain, palpitations, orthopnea, leg swelling and PND.  Gastrointestinal: Negative.  Negative for nausea, abdominal pain, diarrhea, constipation and blood in stool.  Endocrine: Negative.   Genitourinary: Negative.   Musculoskeletal: Negative.  Negative for arthralgias, back pain, myalgias and neck pain.  Skin: Negative.   Allergic/Immunologic: Negative.   Neurological: Negative.  Negative for dizziness, tremors, weakness, light-headedness, numbness and headaches.  Hematological: Negative.  Negative for adenopathy. Does not bruise/bleed easily.  Psychiatric/Behavioral: Negative.        Objective:   Physical Exam  Vitals reviewed. Constitutional: She is oriented to person, place, and time. She appears well-developed and well-nourished. No distress.  HENT:  Head: Normocephalic and atraumatic.  Mouth/Throat: Oropharynx is clear and moist. No oropharyngeal exudate.  Eyes: Conjunctivae are normal. Right eye exhibits  no discharge. Left eye exhibits no discharge. No scleral icterus.  Neck: Normal range of motion. Neck supple. No JVD present. No tracheal deviation present. No thyromegaly present.  Cardiovascular: Normal rate, regular rhythm, normal heart sounds and intact distal pulses.  Exam reveals no gallop and no friction rub.   No murmur heard. Pulmonary/Chest: Effort normal and breath sounds normal. No stridor. No respiratory distress. She has no wheezes. She has no rales. She exhibits no tenderness.  Abdominal: Soft. Bowel sounds are normal. She exhibits no distension and no mass. There is no tenderness. There is no rebound and no guarding.  Musculoskeletal: Normal range of motion. She exhibits no edema and no tenderness.  Lymphadenopathy:    She has no cervical adenopathy.  Neurological: She is oriented to person, place, and time.  Skin: Skin is warm and dry. No rash noted. She is not diaphoretic. No erythema. No pallor.     Lab Results  Component Value Date   WBC 5.9 06/08/2013   HGB 13.3 06/08/2013   HCT 39.0 06/08/2013   PLT 270.0 06/08/2013   GLUCOSE 85 04/02/2014   CHOL 184 06/08/2013   TRIG 52.0 06/08/2013   HDL 56.30 06/08/2013   LDLCALC 117* 06/08/2013   ALT 16 04/02/2014   AST 21 04/02/2014   NA 134* 04/02/2014   K 4.5 04/02/2014   CL 96 04/02/2014   CREATININE 0.94 04/02/2014   BUN 19 04/02/2014   CO2 27 04/02/2014   TSH 3.197 04/02/2014   INR 1.59* 11/19/2010   HGBA1C 6.0 06/08/2013       Assessment & Plan:

## 2014-04-12 NOTE — Patient Instructions (Signed)

## 2014-04-13 NOTE — Assessment & Plan Note (Signed)
I will check her FLP today 

## 2014-04-13 NOTE — Assessment & Plan Note (Signed)
Her BP is well controlled 

## 2014-04-16 ENCOUNTER — Other Ambulatory Visit: Payer: Self-pay | Admitting: "Endocrinology

## 2014-05-14 ENCOUNTER — Encounter: Payer: Self-pay | Admitting: Internal Medicine

## 2014-05-14 ENCOUNTER — Other Ambulatory Visit (INDEPENDENT_AMBULATORY_CARE_PROVIDER_SITE_OTHER): Payer: BC Managed Care – PPO

## 2014-05-14 ENCOUNTER — Other Ambulatory Visit: Payer: Self-pay | Admitting: Internal Medicine

## 2014-05-14 DIAGNOSIS — E785 Hyperlipidemia, unspecified: Secondary | ICD-10-CM

## 2014-05-14 LAB — LIPID PANEL
CHOLESTEROL: 139 mg/dL (ref 0–200)
HDL: 73.2 mg/dL (ref >39.00)
LDL Cholesterol: 62 mg/dL (ref 0–99)
NonHDL: 65.8
TRIGLYCERIDES: 19 mg/dL (ref 0.0–149.0)
Total CHOL/HDL Ratio: 2
VLDL: 3.8 mg/dL (ref 0.0–40.0)

## 2014-05-19 ENCOUNTER — Other Ambulatory Visit: Payer: Self-pay | Admitting: "Endocrinology

## 2014-05-30 ENCOUNTER — Other Ambulatory Visit: Payer: Self-pay | Admitting: Orthopedic Surgery

## 2014-06-06 ENCOUNTER — Encounter (HOSPITAL_COMMUNITY): Payer: Self-pay | Admitting: Pharmacy Technician

## 2014-06-07 ENCOUNTER — Other Ambulatory Visit (HOSPITAL_COMMUNITY): Payer: BC Managed Care – PPO

## 2014-06-07 NOTE — Pre-Procedure Instructions (Signed)
Danielle Carson  06/07/2014   Your procedure is scheduled on: Monday, June 18, 2014  Report to [facility]ANCE A  at 5:30 AM.  Call this number if you have problems the morning of surgery: [phone]   Remember:   Do not eat food or drink liquids after midnight Sunday, June 17, 2014   Take these medicines the morning of surgery with A SIP OF WATER: None Stop taking Aspirin, vitamins and herbal medications (glucosamine-chondroitin )  Do not take any NSAIDs ie: Ibuprofen, Advil, Naproxen or any medication containing Aspirin. Stop 5 days prior to procedure ( Wed. 06/13/14)   Do not wear jewelry, make-up or nail polish.   Do not wear lotions, powders, or perfumes. You may wear deodorant.   Do not shave 48 hours prior to surgery.    Do not bring valuables to the hospital.  [facility] is not responsible  for any belongings or valuables.               Contacts, dentures or bridgework may not be worn into surgery.   Leave suitcase in the car. After surgery it may be brought to your room.   For patients admitted to the hospital, discharge time is determined by your treatment team.               Patients discharged the day of surgery will not be allowed to drive home.  Name and phone number of your driver: /w SPOUSE  Special Instructions:  Special Instructions:Special Instructions: [facility] - Preparing for Surgery  Before surgery, you can play an important role.  Because skin is not sterile, your skin needs to be as free of germs as possible.  You can reduce the number of germs on you skin by washing with CHG (chlorahexidine gluconate) soap before surgery.  CHG is an antiseptic cleaner which kills germs and bonds with the skin to continue killing germs even after washing.  Please DO NOT use if you have an allergy to CHG or antibacterial soaps.  If your skin becomes reddened/irritated stop using the CHG and inform your nurse when you arrive at Short  Stay.  Do not shave (including legs and underarms) for at least 48 hours prior to the first CHG shower.  You may shave your face.  Please follow these instructions carefully:   1.  Shower with CHG Soap the night before surgery and the morning of Surgery.  2.  If you choose to wash your hair, wash your hair first as usual with your normal shampoo.  3.  After you shampoo, rinse your hair and body thoroughly to remove the Shampoo.  4.  Use CHG as you would any other liquid soap.  You can apply chg directly  to the skin and wash gently with scrungie or a clean washcloth.  5.  Apply the CHG Soap to your body ONLY FROM THE NECK DOWN.  Do not use on open wounds or open sores.  Avoid contact with your eyes, ears, mouth and genitals (private parts).  Wash genitals (private parts) with your normal soap.  6.  Wash thoroughly, paying special attention to the area where your surgery will be performed.  7.  Thoroughly rinse your body with warm water from the neck down.  8.  DO NOT shower/wash with your normal soap after using and rinsing off the CHG Soap.  9.  Pat yourself dry with a clean towel.  10.  Wear clean pajamas.            11.  Place clean sheets on your bed the night of your first shower and do not sleep with pets.  Day of Surgery  Do not apply any lotionsthe morning of surgery.  Please wear clean clothes to the hospital/surgery center.   Please read over the following fact sheets that you were given: Pain Booklet, Coughing and Deep Breathing, Blood Transfusion Information, MRSA Information and Surgical Site Infection Prevention

## 2014-06-11 ENCOUNTER — Encounter (HOSPITAL_COMMUNITY): Payer: Self-pay

## 2014-06-11 ENCOUNTER — Encounter (HOSPITAL_COMMUNITY)
Admission: RE | Admit: 2014-06-11 | Discharge: 2014-06-11 | Disposition: A | Discharge status: Home / Self Care | Payer: BC Managed Care – PPO | Source: Ambulatory Visit | Attending: Orthopedic Surgery | Admitting: Orthopedic Surgery

## 2014-06-11 DIAGNOSIS — Z01818 Encounter for other preprocedural examination: Secondary | ICD-10-CM | POA: Insufficient documentation

## 2014-06-11 DIAGNOSIS — Z01812 Encounter for preprocedural laboratory examination: Secondary | ICD-10-CM | POA: Insufficient documentation

## 2014-06-11 HISTORY — DX: Nausea with vomiting, unspecified: R11.2

## 2014-06-11 HISTORY — DX: Unspecified osteoarthritis, unspecified site: M19.90

## 2014-06-11 HISTORY — DX: Essential (primary) hypertension: I10

## 2014-06-11 HISTORY — DX: Adverse effect of unspecified anesthetic, initial encounter: T41.45XA

## 2014-06-11 HISTORY — DX: Phlebitis and thrombophlebitis of unspecified site: I80.9

## 2014-06-11 HISTORY — DX: Other specified postprocedural states: Z98.890

## 2014-06-11 HISTORY — DX: Other complications of anesthesia, initial encounter: T88.59XA

## 2014-06-11 HISTORY — DX: Age-related osteoporosis without current pathological fracture: M81.0

## 2014-06-11 LAB — URINALYSIS, ROUTINE W REFLEX MICROSCOPIC
Bilirubin Urine: NEGATIVE
GLUCOSE, UA: NEGATIVE mg/dL
Hgb urine dipstick: NEGATIVE
Ketones, ur: NEGATIVE mg/dL
Leukocytes, UA: NEGATIVE
Nitrite: NEGATIVE
PROTEIN: NEGATIVE mg/dL
Specific Gravity, Urine: 1.01 (ref 1.005–1.030)
Urobilinogen, UA: 0.2 mg/dL (ref 0.0–1.0)
pH: 7 (ref 5.0–8.0)

## 2014-06-11 LAB — BASIC METABOLIC PANEL
ANION GAP: 13 (ref 5–15)
BUN: 12 mg/dL (ref 6–23)
CALCIUM: 9.2 mg/dL (ref 8.4–10.5)
CO2: 26 mEq/L (ref 19–32)
Chloride: 99 mEq/L (ref 96–112)
Creatinine, Ser: 0.82 mg/dL (ref 0.50–1.10)
GFR calc non Af Amer: 69 mL/min — ABNORMAL LOW (ref >90)
GFR, EST AFRICAN AMERICAN: 80 mL/min — AB (ref >90)
GLUCOSE: 107 mg/dL — AB (ref 70–99)
POTASSIUM: 4.1 meq/L (ref 3.7–5.3)
SODIUM: 138 meq/L (ref 137–147)

## 2014-06-11 LAB — CBC WITH DIFFERENTIAL/PLATELET
Basophils Absolute: 0 10*3/uL (ref 0.0–0.1)
Basophils Relative: 1 % (ref 0–1)
EOS ABS: 0.1 10*3/uL (ref 0.0–0.7)
Eosinophils Relative: 1 % (ref 0–5)
HEMATOCRIT: 37.3 % (ref 36.0–46.0)
Hemoglobin: 12.6 g/dL (ref 12.0–15.0)
Lymphocytes Relative: 20 % (ref 12–46)
Lymphs Abs: 1 10*3/uL (ref 0.7–4.0)
MCH: 30.7 pg (ref 26.0–34.0)
MCHC: 33.8 g/dL (ref 30.0–36.0)
MCV: 90.8 fL (ref 78.0–100.0)
MONO ABS: 0.5 10*3/uL (ref 0.1–1.0)
MONOS PCT: 10 % (ref 3–12)
NEUTROS ABS: 3.5 10*3/uL (ref 1.7–7.7)
Neutrophils Relative %: 68 % (ref 43–77)
Platelets: 269 10*3/uL (ref 150–400)
RBC: 4.11 MIL/uL (ref 3.87–5.11)
RDW: 14.4 % (ref 11.5–15.5)
WBC: 5 10*3/uL (ref 4.0–10.5)

## 2014-06-11 LAB — SURGICAL PCR SCREEN
MRSA, PCR: NEGATIVE
Staphylococcus aureus: NEGATIVE

## 2014-06-11 LAB — TYPE AND SCREEN
ABO/RH(D): O POS
Antibody Screen: NEGATIVE

## 2014-06-11 LAB — PROTIME-INR
INR: 1.06 (ref 0.00–1.49)
Prothrombin Time: 13.8 seconds (ref 11.6–15.2)

## 2014-06-11 LAB — APTT: APTT: 28 s (ref 24–37)

## 2014-06-11 NOTE — Progress Notes (Signed)
Pt followed by Dr. Jamison OkaJones, [place], for PCP.  Echo done earlier this year /w pt. 's report of SOB.  Results normal, no need for cardiac followup.  Pt. Is followed by Dr. Fransico MichaelBrennan- (endocrinology) for osteoporosis.

## 2014-06-11 NOTE — Progress Notes (Signed)
Anesthesia PAT Evaluation:  Patient is a 73 year old female scheduled for revision of left THA for recurrent dislocation of THA on 06/18/14 by Dr. Turner Danielsowan.  History includes non-smoker, HTN, osteoporosis, phlebitis, diverticulosis, arthritis, hiatal hernia, trapezius muscle spasm 09/2548/14, tonsillectomy, post-operative N/V.  She is s/p bilateral THA '97 with right THA revision 11/2009 (regional, Dr. Jacklynn BueMassagee) and left THA revision 11/2010 (regional, Dr. Katrinka BlazingSmith) with closed reduction for left hip dislocation 11/01/11 (conscious sedation with propofol by ED), 03/17/12 (GA, Dr. Lucille PassyAlexander Fortune), and 01/24/14 (GA, Dr. Laverle HobbyGregory Smith).   PCP is Dr. Sanda Lingerhomas Jones. She is a professor at [employer].  Her daughter-in-law, Moise Boringmy Fairfield, does credentialing for [employer] [place] and works out of [place].  Meds: Melatonin, ASA 81 mg, Lipitor, Oscal with D, ibuprofen, Cozaar, glucosamine-chondroitin.  ASA and NSAIDS are on hold for surgery.  EKG on 12/13/13 showed: SB, T wave abnormality, consider inferior ischemia.  Artifact.   Stress echo on 12/22/13: - Stress ECG conclusions: There were no stress arrhythmias or conduction abnormalities. The stress ECG sowed additional 2 mm ST segment horizontal/downsloping depression - Staged echo: There was no echocardiographic evidence for stress-induced ischemia. Non diagnostic ECG response due to baseline abnormalities.  CXR on 06/11/14/ showed no active disease.  Preoperative labs noted.  CBC, PT/PTT, UA WNL.  On exam, she is pleasant Caucasian female in NAD.  Her BMI is 23.  Heart RRR, no murmur noted. Lungs clear.  No LE edema.   Patient requested to be seen to discuss the possibility of regional anesthesia (ie spinal) versus general anesthesia for this procedure. She has had significant post-operative N/V in the past with GA and felt panicked when she awoke from GA following one of her last surgeries.  I reviewed above with anesthesiologist Dr.  Gelene MinkFrederick.  Based on her history of multiple hip surgeries (redo hip revision) and potential for longer duration of surgery and increased fluid shifts/pressure fluctuations he felt she should likely undergo GA over spinal; however, a final determination can be made by her assigned anesthesiologist after evaluation and discuss with Dr. Turner Danielsowan on the morning of surgery.  She is comfortable with this and says she wants to do whatever her anesthesiologist and Dr. Turner Danielsowan feel are safest for her.  (I left a message with Olegario MessierKathy at Dr. Wadie Lessenowan's office communicating this.) Patient will discuss further on the day of surgery.  She does know that even if spinal anesthesia is used, there is still the potential that she could require GA.   Velna Ochsllison Zelenak, PA-C [facility]/Anesthesiology Phone [phone] 06/11/2014 12:07 PM

## 2014-06-11 NOTE — Anesthesia Preprocedure Evaluation (Addendum)
Anesthesia Evaluation  Patient identified by MRN, date of birth, ID band Patient awake    Reviewed: Allergy & Precautions, H&P , NPO status , Patient's Chart, lab work & pertinent test results, reviewed documented beta blocker date and time   History of Anesthesia Complications (+) PONV  Airway Mallampati: II TM Distance: >3 FB Neck ROM: Full    Dental  (+) Teeth Intact, Dental Advisory Given   Pulmonary shortness of breath,          Cardiovascular hypertension, Pt. on medications     Neuro/Psych    GI/Hepatic   Endo/Other    Renal/GU      Musculoskeletal   Abdominal   Peds  Hematology   Anesthesia Other Findings   Reproductive/Obstetrics                         Anesthesia Physical Anesthesia Plan  ASA: III  Anesthesia Plan: Spinal and General/Spinal   Post-op Pain Management:    Induction: Intravenous  Airway Management Planned: Mask and Oral ETT  Additional Equipment:   Intra-op Plan:   Post-operative Plan: Extubation in OR  Informed Consent: I have reviewed the patients History and Physical, chart, labs and discussed the procedure including the risks, benefits and alternatives for the proposed anesthesia with the patient or authorized representative who has indicated his/her understanding and acceptance.   Dental advisory given  Plan Discussed with: CRNA and Anesthesiologist  Anesthesia Plan Comments: (See my anesthesia note. She would like for spinal anesthesia to at least be considered, but wants whatever her anesthesiologist and Dr. Turner Danielsowan feel is the safest and best option for her.  Shonna ChockAllison Zelenak, PA-C)      Anesthesia Quick Evaluation

## 2014-06-11 NOTE — Progress Notes (Signed)
Pt. Wishes to visit with anesth. PA-C today regarding request for regional anesth. For DOS.  Call to A. Zelenak,PA-C , she will see pt.

## 2014-06-13 NOTE — H&P (Signed)
TOTAL HIP REVISION ADMISSION H&P  Patient is admitted for left revision total hip arthroplasty.  Subjective:  Chief Complaint: left hip pain  HPI: Danielle Carson, 73 y.o. 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 The indications for the revision total hip arthroplasty are recurrent dislocation.  Onset of symptoms was abrupt starting 2 years ago with rapidlly worsening course since that time.  Prior procedures on the left hip include arthroplasty.  Patient currently rates pain in the left hip at 10 out of 10 with activity.  There is worsening of pain with activity and weight bearing, pain that interfers with activities of daily living and pain with passive range of motion.  This condition presents safety issues increasing the risk of falls.   There is no current active infection.  Patient Active Problem List   Diagnosis Date Noted  . Abnormal thyroid blood test 04/05/2014  . DOE (dyspnea on exertion) 12/13/2013  . Hyperlipidemia LDL goal < 100 12/13/2013  . Nonspecific abnormal electrocardiogram (ECG) (EKG) 12/13/2013  . Other abnormal glucose 06/08/2013  . Vitamin D deficiency disease 10/03/2012  . Secondary hyperparathyroidism, non-renal 10/03/2012  . Hypertension 10/03/2012  . Osteoporosis 03/31/2011  . Hyperlipidemia LDL goal < 130 03/31/2011   Past Medical History  Diagnosis Date  . Hiatal hernia   . Diverticulosis   . Trapezius muscle spasm 10/04/2013  . Complication of anesthesia   . PONV (postoperative nausea and vomiting)   . Hypertension   . Phlebitis     postpartum- 50+ yrs.   . Osteoporosis   . Arthritis     cervical DDD- seen on CAT & R knee    Past Surgical History  Procedure Laterality Date  . Joint  replacement      bilat hip replacements and left hip revision  . Hip closed reduction  03/17/2012    Procedure: CLOSED MANIPULATION HIP;  Surgeon: Nestor LewandowskyFrank J Rowan, MD;  Location: [facility];  Service: Orthopedics;  Laterality: Left;  . Hip closed reduction Left 01/24/2014    Procedure: CLOSED REDUCTION HIP;  Surgeon: Nestor LewandowskyFrank J Rowan, MD;  Location: [facility];  Service: Orthopedics;  Laterality: Left;  . Tonsillectomy      as a child     No prescriptions prior to admission   No Known Allergies  History  Substance Use Topics  . Smoking status: Never Smoker   . Smokeless tobacco: Never Used  . Alcohol Use: 3.0 oz/week    5 Glasses of wine per week    Family History  Problem Relation Age of Onset  . Cardiomyopathy Mother   . Osteoarthritis Mother   . Cardiomyopathy Sister   . Osteoarthritis Sister   . Heart disease Sister   . Heart disease Father   . Stroke Brother   . Cancer Neg Hx   . Alcohol abuse Neg Hx   . COPD Neg Hx   . Depression Neg Hx   . Diabetes Neg Hx   . Drug abuse Neg Hx   . Early death Neg Hx   . Hearing loss Neg Hx   . Hyperlipidemia Neg Hx   . Hypertension Neg Hx   . Kidney disease Neg Hx       Review of Systems  Constitutional: Negative.   HENT: Negative.   Eyes: Negative.  Respiratory: Negative.   Cardiovascular: Negative.   Gastrointestinal: Negative.   Genitourinary: Negative.   Musculoskeletal: Positive for joint pain.  Skin: Negative.   Neurological: Negative.   Psychiatric/Behavioral: Negative.     Objective:  Physical Exam  Constitutional: She is oriented to person, place, and time. She appears well-developed and well-nourished.  HENT:  Head: Normocephalic and atraumatic.  Eyes: Pupils are equal, round, and reactive to light.  Neck: Normal range of motion. Neck supple.  Cardiovascular: Intact distal pulses.   Respiratory: Effort normal.  Neurological: She is alert and oriented to person, place, and time.  Skin: Skin is warm and dry.   Psychiatric: She has a normal mood and affect. Her behavior is normal. Judgment and thought content normal.    Vital signs in last 24 hours:     Labs:   Estimated body mass index is 23.23 kg/(m^2) as calculated from the following:   Height as of 04/12/14: 5\' 8"  (1.727 m).   Weight as of 04/12/14: 69.287 kg (152 lb 12 oz).  Imaging Review:  Post reduction x-ray showed normal geometry the revision itself was done back in December 2011 and the first dislocation was in December of 2012.  Both dislocations were because of flexion with adduction and internal rotation.  Assessment/Plan:  End stage arthritis, left hip(s) with failed previous arthroplasty.  The patient history, physical examination, clinical judgement of the provider and imaging studies are consistent with end stage degenerative joint disease of the left hip(s), previous total hip arthroplasty. Revision total hip arthroplasty is deemed medically necessary. The treatment options including medical management, injection therapy, arthroscopy and arthroplasty were discussed at length. The risks and benefits of total hip arthroplasty were presented and reviewed. The risks due to aseptic loosening, infection, stiffness, dislocation/subluxation,  thromboembolic complications and other imponderables were discussed.  The patient acknowledged the explanation, agreed to proceed with the plan and consent was signed. Patient is being admitted for inpatient treatment for surgery, pain control, PT, OT, prophylactic antibiotics, VTE prophylaxis, progressive ambulation and ADL's and discharge planning. The patient is planning to be discharged home with home health services

## 2014-06-15 ENCOUNTER — Other Ambulatory Visit: Payer: Self-pay | Admitting: "Endocrinology

## 2014-06-17 MED ORDER — DEXTROSE-NACL 5-0.45 % IV SOLN: INTRAVENOUS | Status: DC

## 2014-06-17 MED ORDER — CHLORHEXIDINE GLUCONATE 4 % EX LIQD
60.0000 mL | Freq: Once | CUTANEOUS | Status: DC
  Filled 2014-06-17: qty 60

## 2014-06-17 MED ORDER — CEFAZOLIN SODIUM-DEXTROSE 2-3 GM-% IV SOLR
2.0000 g | INTRAVENOUS | Status: AC
  Administered 2014-06-18: 2 g via INTRAVENOUS
  Filled 2014-06-17: qty 50

## 2014-06-18 ENCOUNTER — Encounter (HOSPITAL_COMMUNITY)
Admission: RE | Disposition: A | Discharge status: Home / Self Care | Payer: Self-pay | Source: Ambulatory Visit | Attending: Orthopedic Surgery

## 2014-06-18 ENCOUNTER — Encounter (HOSPITAL_COMMUNITY): Payer: BC Managed Care – PPO | Admitting: Vascular Surgery

## 2014-06-18 ENCOUNTER — Inpatient Hospital Stay (HOSPITAL_COMMUNITY): Payer: BC Managed Care – PPO | Admitting: Certified Registered Nurse Anesthetist

## 2014-06-18 ENCOUNTER — Inpatient Hospital Stay (HOSPITAL_COMMUNITY)
Admission: RE | Admit: 2014-06-18 | Discharge: 2014-06-20 | DRG: 468 | Disposition: A | Discharge status: Home / Self Care | Payer: BC Managed Care – PPO | Source: Ambulatory Visit | Attending: Orthopedic Surgery | Admitting: Orthopedic Surgery

## 2014-06-18 ENCOUNTER — Inpatient Hospital Stay (HOSPITAL_COMMUNITY): Payer: BC Managed Care – PPO

## 2014-06-18 ENCOUNTER — Encounter (HOSPITAL_COMMUNITY): Payer: Self-pay | Admitting: Surgery

## 2014-06-18 DIAGNOSIS — Y831 Surgical operation with implant of artificial internal device as the cause of abnormal reaction of the patient, or of later complication, without mention of misadventure at the time of the procedure: Secondary | ICD-10-CM | POA: Diagnosis present

## 2014-06-18 DIAGNOSIS — Z96649 Presence of unspecified artificial hip joint: Secondary | ICD-10-CM

## 2014-06-18 DIAGNOSIS — I10 Essential (primary) hypertension: Secondary | ICD-10-CM | POA: Diagnosis present

## 2014-06-18 DIAGNOSIS — T84029A Dislocation of unspecified internal joint prosthesis, initial encounter: Principal | ICD-10-CM | POA: Diagnosis present

## 2014-06-18 DIAGNOSIS — E211 Secondary hyperparathyroidism, not elsewhere classified: Secondary | ICD-10-CM | POA: Diagnosis present

## 2014-06-18 DIAGNOSIS — M810 Age-related osteoporosis without current pathological fracture: Secondary | ICD-10-CM | POA: Diagnosis present

## 2014-06-18 DIAGNOSIS — M169 Osteoarthritis of hip, unspecified: Secondary | ICD-10-CM | POA: Diagnosis present

## 2014-06-18 DIAGNOSIS — E785 Hyperlipidemia, unspecified: Secondary | ICD-10-CM | POA: Diagnosis present

## 2014-06-18 DIAGNOSIS — M1610 Unilateral primary osteoarthritis, unspecified hip: Secondary | ICD-10-CM | POA: Diagnosis present

## 2014-06-18 DIAGNOSIS — M5030 Other cervical disc degeneration, unspecified cervical region: Secondary | ICD-10-CM | POA: Diagnosis present

## 2014-06-18 DIAGNOSIS — K5730 Diverticulosis of large intestine without perforation or abscess without bleeding: Secondary | ICD-10-CM | POA: Diagnosis present

## 2014-06-18 HISTORY — PX: TOTAL HIP REVISION: SHX763

## 2014-06-18 HISTORY — PX: REVISION TOTAL HIP ARTHROPLASTY: SHX766

## 2014-06-18 SURGERY — TOTAL HIP REVISION
Anesthesia: Spinal | Site: Hip | Laterality: Left

## 2014-06-18 MED ORDER — DIPHENHYDRAMINE HCL 12.5 MG/5ML PO ELIX: 12.5000 mg | ORAL_SOLUTION | ORAL | Status: DC | PRN

## 2014-06-18 MED ORDER — ASPIRIN EC 325 MG PO TBEC
325.0000 mg | DELAYED_RELEASE_TABLET | Freq: Every day | ORAL | Status: DC
  Administered 2014-06-19 – 2014-06-20 (×2): 325 mg via ORAL
  Filled 2014-06-18 (×3): qty 1

## 2014-06-18 MED ORDER — PHENYLEPHRINE HCL 10 MG/ML IJ SOLN
INTRAMUSCULAR | Status: DC | PRN
  Administered 2014-06-18 (×2): 40 ug via INTRAVENOUS

## 2014-06-18 MED ORDER — PROPOFOL 10 MG/ML IV EMUL
INTRAVENOUS | Status: AC
  Filled 2014-06-18: qty 200

## 2014-06-18 MED ORDER — METHOCARBAMOL 500 MG PO TABS
500.0000 mg | ORAL_TABLET | Freq: Four times a day (QID) | ORAL | Status: DC | PRN
  Administered 2014-06-18 (×2): 500 mg via ORAL
  Filled 2014-06-18 (×2): qty 1

## 2014-06-18 MED ORDER — HYDROMORPHONE HCL PF 1 MG/ML IJ SOLN
1.0000 mg | INTRAMUSCULAR | Status: DC | PRN
  Administered 2014-06-18: 1 mg via INTRAVENOUS
  Filled 2014-06-18 (×2): qty 1

## 2014-06-18 MED ORDER — MAGNESIUM CITRATE PO SOLN: 1.0000 | Freq: Once | ORAL | Status: AC | PRN

## 2014-06-18 MED ORDER — METOCLOPRAMIDE HCL 5 MG/ML IJ SOLN: 5.0000 mg | Freq: Three times a day (TID) | INTRAMUSCULAR | Status: DC | PRN

## 2014-06-18 MED ORDER — NEOSTIGMINE METHYLSULFATE 10 MG/10ML IV SOLN
INTRAVENOUS | Status: AC
  Filled 2014-06-18: qty 1

## 2014-06-18 MED ORDER — HYDROMORPHONE HCL PF 1 MG/ML IJ SOLN
0.2500 mg | INTRAMUSCULAR | Status: DC | PRN
Start: 2014-06-18 — End: 2014-06-18

## 2014-06-18 MED ORDER — ACETAMINOPHEN 325 MG PO TABS
650.0000 mg | ORAL_TABLET | Freq: Four times a day (QID) | ORAL | Status: DC | PRN
  Administered 2014-06-19 – 2014-06-20 (×3): 650 mg via ORAL
  Filled 2014-06-18 (×3): qty 2

## 2014-06-18 MED ORDER — BUPIVACAINE-EPINEPHRINE 0.5% -1:200000 IJ SOLN
INTRAMUSCULAR | Status: DC | PRN
  Administered 2014-06-18: 10 mL

## 2014-06-18 MED ORDER — OXYCODONE HCL 5 MG/5ML PO SOLN: 5.0000 mg | Freq: Once | ORAL | Status: DC | PRN

## 2014-06-18 MED ORDER — METHOCARBAMOL 1000 MG/10ML IJ SOLN
500.0000 mg | Freq: Four times a day (QID) | INTRAVENOUS | Status: DC | PRN
  Filled 2014-06-18: qty 5

## 2014-06-18 MED ORDER — LOSARTAN POTASSIUM 50 MG PO TABS
50.0000 mg | ORAL_TABLET | Freq: Every day | ORAL | Status: DC
  Administered 2014-06-18 – 2014-06-20 (×3): 50 mg via ORAL
  Filled 2014-06-18 (×3): qty 1

## 2014-06-18 MED ORDER — ONDANSETRON HCL 4 MG/2ML IJ SOLN
INTRAMUSCULAR | Status: DC | PRN
  Administered 2014-06-18: 4 mg via INTRAVENOUS

## 2014-06-18 MED ORDER — LACTATED RINGERS IV SOLN
INTRAVENOUS | Status: DC | PRN
  Administered 2014-06-18 (×2): via INTRAVENOUS

## 2014-06-18 MED ORDER — EPHEDRINE SULFATE 50 MG/ML IJ SOLN
INTRAMUSCULAR | Status: DC | PRN
  Administered 2014-06-18 (×4): 10 mg via INTRAVENOUS

## 2014-06-18 MED ORDER — BISACODYL 5 MG PO TBEC: 5.0000 mg | DELAYED_RELEASE_TABLET | Freq: Every day | ORAL | Status: DC | PRN

## 2014-06-18 MED ORDER — ACETAMINOPHEN 650 MG RE SUPP: 650.0000 mg | Freq: Four times a day (QID) | RECTAL | Status: DC | PRN

## 2014-06-18 MED ORDER — GLYCOPYRROLATE 0.2 MG/ML IJ SOLN
INTRAMUSCULAR | Status: DC | PRN
  Administered 2014-06-18 (×2): 0.4 mg via INTRAVENOUS

## 2014-06-18 MED ORDER — LIDOCAINE HCL (CARDIAC) 20 MG/ML IV SOLN
INTRAVENOUS | Status: AC
  Filled 2014-06-18: qty 5

## 2014-06-18 MED ORDER — BUPIVACAINE IN DEXTROSE 0.75-8.25 % IT SOLN
INTRATHECAL | Status: DC | PRN
  Administered 2014-06-18: 12 mg via INTRATHECAL

## 2014-06-18 MED ORDER — GLYCOPYRROLATE 0.2 MG/ML IJ SOLN
INTRAMUSCULAR | Status: AC
  Filled 2014-06-18: qty 6

## 2014-06-18 MED ORDER — EPHEDRINE SULFATE 50 MG/ML IJ SOLN
INTRAMUSCULAR | Status: AC
  Filled 2014-06-18: qty 1

## 2014-06-18 MED ORDER — PROPOFOL 10 MG/ML IV BOLUS
INTRAVENOUS | Status: AC
  Filled 2014-06-18: qty 20

## 2014-06-18 MED ORDER — OXYCODONE HCL 5 MG PO TABS
5.0000 mg | ORAL_TABLET | ORAL | Status: DC | PRN
  Administered 2014-06-18: 5 mg via ORAL
  Administered 2014-06-18: 10 mg via ORAL
  Administered 2014-06-19: 5 mg via ORAL
  Filled 2014-06-18 (×2): qty 1
  Filled 2014-06-18: qty 2

## 2014-06-18 MED ORDER — SODIUM CHLORIDE 0.9 % IR SOLN
Status: DC | PRN
  Administered 2014-06-18: 1000 mL

## 2014-06-18 MED ORDER — ONDANSETRON HCL 4 MG/2ML IJ SOLN: 4.0000 mg | Freq: Four times a day (QID) | INTRAMUSCULAR | Status: DC | PRN

## 2014-06-18 MED ORDER — PHENOL 1.4 % MT LIQD: 1.0000 | OROMUCOSAL | Status: DC | PRN

## 2014-06-18 MED ORDER — OXYCODONE HCL 5 MG PO TABS: 5.0000 mg | ORAL_TABLET | Freq: Once | ORAL | Status: DC | PRN

## 2014-06-18 MED ORDER — STERILE WATER FOR INJECTION IJ SOLN
INTRAMUSCULAR | Status: AC
  Filled 2014-06-18: qty 10

## 2014-06-18 MED ORDER — PROPOFOL 10 MG/ML IV BOLUS
INTRAVENOUS | Status: DC | PRN
  Administered 2014-06-18: 30 mg via INTRAVENOUS
  Administered 2014-06-18: 150 mg via INTRAVENOUS

## 2014-06-18 MED ORDER — OXYCODONE-ACETAMINOPHEN 5-325 MG PO TABS: 1.0000 | ORAL_TABLET | ORAL | Status: DC | PRN

## 2014-06-18 MED ORDER — MIDAZOLAM HCL 5 MG/5ML IJ SOLN
INTRAMUSCULAR | Status: DC | PRN
  Administered 2014-06-18: 2 mg via INTRAVENOUS

## 2014-06-18 MED ORDER — ASPIRIN EC 325 MG PO TBEC: 325.0000 mg | DELAYED_RELEASE_TABLET | Freq: Two times a day (BID) | ORAL | Status: AC

## 2014-06-18 MED ORDER — ONDANSETRON HCL 4 MG/2ML IJ SOLN
INTRAMUSCULAR | Status: AC
  Filled 2014-06-18: qty 2

## 2014-06-18 MED ORDER — SENNOSIDES-DOCUSATE SODIUM 8.6-50 MG PO TABS: 1.0000 | ORAL_TABLET | Freq: Every evening | ORAL | Status: DC | PRN

## 2014-06-18 MED ORDER — PROMETHAZINE HCL 25 MG/ML IJ SOLN
INTRAMUSCULAR | Status: AC
  Filled 2014-06-18: qty 1

## 2014-06-18 MED ORDER — BUPIVACAINE-EPINEPHRINE (PF) 0.5% -1:200000 IJ SOLN
INTRAMUSCULAR | Status: AC
  Filled 2014-06-18: qty 30

## 2014-06-18 MED ORDER — ROCURONIUM BROMIDE 100 MG/10ML IV SOLN
INTRAVENOUS | Status: DC | PRN
  Administered 2014-06-18: 40 mg via INTRAVENOUS

## 2014-06-18 MED ORDER — KCL IN DEXTROSE-NACL 20-5-0.45 MEQ/L-%-% IV SOLN
INTRAVENOUS | Status: DC
  Administered 2014-06-18: 15:00:00 via INTRAVENOUS
  Filled 2014-06-18 (×8): qty 1000

## 2014-06-18 MED ORDER — FENTANYL CITRATE 0.05 MG/ML IJ SOLN
INTRAMUSCULAR | Status: AC
  Filled 2014-06-18: qty 5

## 2014-06-18 MED ORDER — MELATONIN 1 MG PO TABS: 1.0000 mg | ORAL_TABLET | Freq: Every day | ORAL | Status: DC

## 2014-06-18 MED ORDER — DOCUSATE SODIUM 100 MG PO CAPS
100.0000 mg | ORAL_CAPSULE | Freq: Two times a day (BID) | ORAL | Status: DC
  Administered 2014-06-18 – 2014-06-20 (×4): 100 mg via ORAL
  Filled 2014-06-18 (×4): qty 1

## 2014-06-18 MED ORDER — DEXAMETHASONE SODIUM PHOSPHATE 10 MG/ML IJ SOLN
INTRAMUSCULAR | Status: DC | PRN
  Administered 2014-06-18: 4 mg via INTRAVENOUS

## 2014-06-18 MED ORDER — ONDANSETRON HCL 4 MG PO TABS: 4.0000 mg | ORAL_TABLET | Freq: Four times a day (QID) | ORAL | Status: DC | PRN

## 2014-06-18 MED ORDER — TIZANIDINE HCL 2 MG PO CAPS: 2.0000 mg | ORAL_CAPSULE | Freq: Three times a day (TID) | ORAL | Status: DC

## 2014-06-18 MED ORDER — ROCURONIUM BROMIDE 50 MG/5ML IV SOLN
INTRAVENOUS | Status: AC
  Filled 2014-06-18: qty 1

## 2014-06-18 MED ORDER — MENTHOL 3 MG MT LOZG: 1.0000 | LOZENGE | OROMUCOSAL | Status: DC | PRN

## 2014-06-18 MED ORDER — METOCLOPRAMIDE HCL 5 MG PO TABS
5.0000 mg | ORAL_TABLET | Freq: Three times a day (TID) | ORAL | Status: DC | PRN
  Filled 2014-06-18: qty 2

## 2014-06-18 MED ORDER — MIDAZOLAM HCL 2 MG/2ML IJ SOLN
INTRAMUSCULAR | Status: AC
  Filled 2014-06-18: qty 2

## 2014-06-18 MED ORDER — PROMETHAZINE HCL 25 MG/ML IJ SOLN
6.2500 mg | INTRAMUSCULAR | Status: DC | PRN
  Administered 2014-06-18: 6.25 mg via INTRAVENOUS

## 2014-06-18 MED ORDER — NEOSTIGMINE METHYLSULFATE 10 MG/10ML IV SOLN
INTRAVENOUS | Status: DC | PRN
  Administered 2014-06-18 (×2): 2 mg via INTRAVENOUS

## 2014-06-18 MED ORDER — ATORVASTATIN CALCIUM 10 MG PO TABS
10.0000 mg | ORAL_TABLET | Freq: Every day | ORAL | Status: DC
  Administered 2014-06-18 – 2014-06-19 (×2): 10 mg via ORAL
  Filled 2014-06-18 (×3): qty 1

## 2014-06-18 SURGICAL SUPPLY — 71 items
BALL HIP 32MM PLUS 3 (Hips) ×1 IMPLANT
BLADE SAW SAG 73X25 THK (BLADE)
BLADE SAW SGTL 73X25 THK (BLADE) IMPLANT
BOWL SMART MIX CTS (DISPOSABLE) IMPLANT
BRUSH FEMORAL CANAL (MISCELLANEOUS) IMPLANT
COVER SURGICAL LIGHT HANDLE (MISCELLANEOUS) ×3 IMPLANT
DRAPE C-ARM 42X72 X-RAY (DRAPES) IMPLANT
DRAPE ORTHO SPLIT 77X108 STRL (DRAPES) ×2
DRAPE PROXIMA HALF (DRAPES) ×3 IMPLANT
DRAPE SURG ORHT 6 SPLT 77X108 (DRAPES) ×1 IMPLANT
DRAPE U-SHAPE 47X51 STRL (DRAPES) ×3 IMPLANT
DRILL BIT 7/64X5 (BIT) ×3 IMPLANT
DRSG AQUACEL AG ADV 3.5X10 (GAUZE/BANDAGES/DRESSINGS) ×3 IMPLANT
DRSG MEPILEX BORDER 4X8 (GAUZE/BANDAGES/DRESSINGS) ×3 IMPLANT
DURAPREP 26ML APPLICATOR (WOUND CARE) ×3 IMPLANT
ELECT BLADE 4.0 EZ CLEAN MEGAD (MISCELLANEOUS) ×3
ELECT BLADE 6.5 EXT (BLADE) ×3 IMPLANT
ELECT REM PT RETURN 9FT ADLT (ELECTROSURGICAL) ×3
ELECTRODE BLDE 4.0 EZ CLN MEGD (MISCELLANEOUS) ×1 IMPLANT
ELECTRODE REM PT RTRN 9FT ADLT (ELECTROSURGICAL) ×1 IMPLANT
EVACUATOR 1/8 PVC DRAIN (DRAIN) IMPLANT
GAUZE XEROFORM 5X9 LF (GAUZE/BANDAGES/DRESSINGS) ×3 IMPLANT
GLOVE BIO SURGEON STRL SZ7.5 (GLOVE) ×3 IMPLANT
GLOVE BIO SURGEON STRL SZ8.5 (GLOVE) ×6 IMPLANT
GLOVE BIOGEL PI IND STRL 8 (GLOVE) ×2 IMPLANT
GLOVE BIOGEL PI IND STRL 9 (GLOVE) ×1 IMPLANT
GLOVE BIOGEL PI INDICATOR 8 (GLOVE) ×4
GLOVE BIOGEL PI INDICATOR 9 (GLOVE) ×2
GLOVE ECLIPSE 6.5 STRL STRAW (GLOVE) ×6 IMPLANT
GOWN STRL REUS W/ TWL LRG LVL3 (GOWN DISPOSABLE) ×2 IMPLANT
GOWN STRL REUS W/ TWL XL LVL3 (GOWN DISPOSABLE) ×5 IMPLANT
GOWN STRL REUS W/TWL LRG LVL3 (GOWN DISPOSABLE) ×4
GOWN STRL REUS W/TWL XL LVL3 (GOWN DISPOSABLE) ×15
HANDPIECE INTERPULSE COAX TIP (DISPOSABLE)
HEEL PROTECTOR  874200 (MISCELLANEOUS)
HEEL PROTECTOR 874200 (MISCELLANEOUS) IMPLANT
HIP BALL 32MM PLUS 3 (Hips) ×3 IMPLANT
HOOD PEEL AWAY FACE SHEILD DIS (HOOD) ×6 IMPLANT
KIT BASIN OR (CUSTOM PROCEDURE TRAY) ×3 IMPLANT
KIT ROOM TURNOVER OR (KITS) ×3 IMPLANT
LINER LOCKING PIN SROM (Hips) ×2 IMPLANT
MANIFOLD NEPTUNE II (INSTRUMENTS) ×3 IMPLANT
NEEDLE 22X1 1/2 (OR ONLY) (NEEDLE) ×3 IMPLANT
NOZZLE PRISM 8.5MM (MISCELLANEOUS) IMPLANT
NS IRRIG 1000ML POUR BTL (IV SOLUTION) ×3 IMPLANT
PACK TOTAL JOINT (CUSTOM PROCEDURE TRAY) ×3 IMPLANT
PAD ARMBOARD 7.5X6 YLW CONV (MISCELLANEOUS) ×6 IMPLANT
PASSER SUT SWANSON 36MM LOOP (INSTRUMENTS) ×3 IMPLANT
PRESSURIZER FEMORAL UNIV (MISCELLANEOUS) IMPLANT
SET HNDPC FAN SPRY TIP SCT (DISPOSABLE) IMPLANT
SLEEVE SURGEON STRL (DRAPES) IMPLANT
SPONGE GAUZE 4X4 12PLY (GAUZE/BANDAGES/DRESSINGS) ×3 IMPLANT
SPONGE LAP 18X18 X RAY DECT (DISPOSABLE) IMPLANT
SROM LINER 0DEG 32MM (Hips) ×3 IMPLANT
SROM LINER LOCKING PIN (Hips) ×6 IMPLANT
STAPLER VISISTAT 35W (STAPLE) ×3 IMPLANT
SUT ETHIBOND 2 V 37 (SUTURE) IMPLANT
SUT VIC AB 0 CTB1 27 (SUTURE) ×3 IMPLANT
SUT VIC AB 1 CTX 36 (SUTURE) ×2
SUT VIC AB 1 CTX36XBRD ANBCTR (SUTURE) ×1 IMPLANT
SUT VIC AB 2-0 CTB1 (SUTURE) ×3 IMPLANT
SUT VIC AB 3-0 CT1 27 (SUTURE) ×2
SUT VIC AB 3-0 CT1 TAPERPNT 27 (SUTURE) ×1 IMPLANT
SYR 20ML ECCENTRIC (SYRINGE) ×3 IMPLANT
SYR CONTROL 10ML LL (SYRINGE) ×3 IMPLANT
TOWEL OR 17X24 6PK STRL BLUE (TOWEL DISPOSABLE) ×3 IMPLANT
TOWEL OR 17X26 10 PK STRL BLUE (TOWEL DISPOSABLE) ×3 IMPLANT
TOWER CARTRIDGE SMART MIX (DISPOSABLE) IMPLANT
TRAY FOLEY CATH 14FR (SET/KITS/TRAYS/PACK) IMPLANT
TUBE ANAEROBIC SPECIMEN COL (MISCELLANEOUS) ×3 IMPLANT
WATER STERILE IRR 1000ML POUR (IV SOLUTION) ×3 IMPLANT

## 2014-06-18 NOTE — Plan of Care (Signed)
Problem: Consults Goal: Diagnosis- Total Joint Replacement Revision Total Hip Left        

## 2014-06-18 NOTE — Progress Notes (Signed)
PHARMACIST - PHYSICIAN ORDER COMMUNICATION  CONCERNING: P&T Medication Policy on Herbal Medications  DESCRIPTION:  This patient's order for:  Melatonin  has been noted.  This product(s) is classified as an "herbal" or natural product. Due to a lack of definitive safety studies or FDA approval, nonstandard manufacturing practices, plus the potential risk of unknown drug-drug interactions while on inpatient medications, the Pharmacy and Therapeutics Committee does not permit the use of "herbal" or natural products of this type within [facility].   ACTION TAKEN: The pharmacy department is unable to verify this order at this time and your patient has been informed of this safety policy. Please reevaluate patient's clinical condition at discharge and address if the herbal or natural product(s) should be resumed at that time.  Link SnufferJessica Hadja Harral, PharmD, BCPS Clinical Pharmacist [phone] 06/18/2014, 10:50 AM

## 2014-06-18 NOTE — Transfer of Care (Signed)
Immediate Anesthesia Transfer of Care Note  Patient: Danielle Carson  Procedure(s) Performed: Procedure(s): TOTAL HIP REVISION,placement of constrained  liner and new femoral head (Left)  Patient Location: PACU  Anesthesia Type:General and Spinal  Level of Consciousness: awake, alert  and oriented  Airway & Oxygen Therapy: Patient Spontanous Breathing and Patient connected to nasal cannula oxygen  Post-op Assessment: Report given to PACU RN and Post -op Vital signs reviewed and stable  Post vital signs: Reviewed and stable  Complications: No apparent anesthesia complications

## 2014-06-18 NOTE — Op Note (Signed)
Preop diagnosis: Recurrent dislocations left total hip  Postoperative diagnosis: Same  Procedure: Revision left total hip arthroplasty with removal of acetabular liner and placement of a 58 mm S-ROM constrained liner and a +3 32 mm metal femoral head component   Surgeon: Feliberto GottronFrank J. Turner Danielsowan M.D.  Assistant: Tomi LikensEric K. Gaylene BrooksPhillips PA-C  (present throughout entire procedure and necessary for timely completion of the procedure)  Estimated blood loss: 200 cc  Fluid replacement: 1500 cc of crystalloid  Complications: None  Specimens: none  Indications: Patient with recurrent dislocations of left total hip. Patient is failed conservative treatment with reeducation, hinge abduction brace, and radiographs show good geometry of the acetabular and femoral components. No history of fevers chills or wound problems. Prevent further dislocations risks and benefits of constrained liner system have been discussed with the patient and consent obtained for surgical her attention.   Procedure: Patient was identified by arm band receive preoperative IV antibiotics [facility]. Taken to the operating room where the appropriate anesthetic monitors were attached and general endotracheal anesthesia induced with the patient in the supine position. Rolled into the right lateral decubitus position and fixed there with a mark 2 pelvic clamp. The limb prepped and draped in usual sterile fashion from the ankle to the hemipelvis. Time out procedure performed. Skin along the lateral hip and thigh infiltrated with 10 cc of 1/2% Marcaine and epinephrine solution. We began the operation by recreating the old posterior lateral incision 15 cm in line through the skin and subcutaneous tissue down to the level of the IT band which was cut in line with the skin incision. This exposed the greater trochanter, and the posterior capsule is absent, fluid was sent for Gram stain and culture although did not appear infected.  We then dislocated a total hip and removed the femoral head with a mallet and metal cylinder.  We continued to remove scar tissue from around the acetabular component. The trunnion was tucked superiorly and anteriorly to the acetabular component, and the polyethylene liner was removed using the extraction tool with removal of bony locking pins. We then carefully cleared around the edges of the acetabular component in preparation for the constrained liner and confirm there was good bone ingrowth. The constrained liner was then selected at the appropriate size and inserted into the acetabular shell and locked into place with 2 standard locking pin. The locking ring with the bevel facing towards the polyethylene liner was placed on the trunnion followed by the femoral head and the hip reduced and the locking ring snapped into place. Stability was checked and found to be excellent. The wound is irrigated out normal saline solution. We then closed the IT band with running #1 Vicryl suture, the subcutaneous tissue with 0 and 2-0 undyed Vicryl suture, the skin was closed with running interlocking 3-0 nylon suture. A dressing of Mepilex was then applied, the patient was unclamped a rolled supine awakened extubated and taken to the recovery without difficulty.

## 2014-06-18 NOTE — Discharge Instructions (Signed)
Total Hip Replacement °Care After °Refer to this sheet in the next few weeks. These instructions provide you with information on caring for yourself after your procedure. Your caregiver also may give you specific instructions. Your treatment has been planned according to the most current medical practices, but problems sometimes occur. Call your caregiver if you have any problems or questions after your procedure. °HOME CARE INSTRUCTIONS  °Your caregiver will give you specific precautions for certain types of movement. Additional instructions include: °· Take over-the-counter or prescription medicines for pain, discomfort, or fever only as directed by your caregiver. °· Take quick showers (3-5 min) rather than bathe until your caregiver tells you that you can take baths again. °· Avoid lifting until your caregiver instructs you otherwise. °· Use a raised toilet seat and avoid sitting in low chairs as instructed by your caregiver. °· Use crutches or a walker as instructed by your caregiver. °SEEK MEDICAL CARE IF: °· You have difficulty breathing. °· Your wound is red, swollen, or has become increasingly painful. °· You have pus draining from your wound. °· You have a bad smell coming from your wound. °· You have persistent bleeding from your wound. °· Your wound breaks open after sutures (stitches) or staples have been removed. °SEEK IMMEDIATE MEDICAL CARE IF:  °· You have a fever. °· You have a rash. °· You have pain or swelling in your calf or thigh. °· You have shortness of breath or chest pain. °MAKE SURE YOU: °· Understand these instructions. °· Will watch your condition. °· Will get help right away if you are not doing well or get worse. °Document Released: 06/12/2005 Document Revised: 05/24/2012 Document Reviewed: 01/10/2012 °Exit[employer]® Patient Information ©2015 [employer]. This information is not intended to replace advice given to you by your health care provider. Make sure you discuss any questions you  have with your health care provider. ° °

## 2014-06-18 NOTE — Anesthesia Postprocedure Evaluation (Signed)
Anesthesia Post Note  Patient: Danielle Boroughsatricia Fairfield-Artman  Procedure(s) Performed: Procedure(s) (LRB): TOTAL HIP REVISION,placement of constrained  liner and new femoral head (Left)  Anesthesia type: general  Patient location: PACU  Post pain: Pain level controlled  Post assessment: Patient's Cardiovascular Status Stable  Last Vitals:  Filed Vitals:   06/18/14 1015  BP:   Pulse: 72  Temp:   Resp: 19    Post vital signs: Reviewed and stable  Level of consciousness: sedated  Complications: No apparent anesthesia complications

## 2014-06-18 NOTE — Interval H&P Note (Signed)
History and Physical Interval Note:  06/18/2014 7:15 AM  Danielle BoroughsPatricia Fairfield-Artman  has presented today for surgery, with the diagnosis of  RECURRENT DISLOCATION LEFT TOTAL HIP ARTHROPLASTY  The various methods of treatment have been discussed with the patient and family. After consideration of risks, benefits and other options for treatment, the patient has consented to  Procedure(s): TOTAL HIP REVISION (Left) as a surgical intervention .  The patient's history has been reviewed, patient examined, no change in status, stable for surgery.  I have reviewed the patient's chart and labs.  Questions were answered to the patient's satisfaction.     [name],Emylia Latella J

## 2014-06-18 NOTE — Anesthesia Procedure Notes (Addendum)
Spinal  Patient location during procedure: pre-op Start time: 06/18/2014 7:12 AM End time: 06/18/2014 7:22 AM Spinal Block Patient position: sitting Prep: Betadine Patient monitoring: heart rate, cardiac monitor, continuous pulse ox and blood pressure Approach: midline Location: L3-4 Injection technique: single-shot Needle Needle type: Pencan  Needle gauge: 24 G Needle length: 9 cm  Procedure Name: Intubation Date/Time: 06/18/2014 7:49 AM Performed by: Reine JustFLOWERS, Margarita Bobrowski T Pre-anesthesia Checklist: Patient identified, Emergency Drugs available, Suction available, Patient being monitored and Timeout performed Patient Re-evaluated:Patient Re-evaluated prior to inductionOxygen Delivery Method: Circle system utilized and Simple face mask Preoxygenation: Pre-oxygenation with 100% oxygen Intubation Type: IV induction Ventilation: Mask ventilation without difficulty Laryngoscope Size: Miller and 3 Grade View: Grade I Tube type: Oral Tube size: 7.5 mm Number of attempts: 1 Airway Equipment and Method: Patient positioned with wedge pillow and Stylet Placement Confirmation: ETT inserted through vocal cords under direct vision,  positive ETCO2 and breath sounds checked- equal and bilateral Secured at: 22 cm Tube secured with: Tape Dental Injury: Teeth and Oropharynx as per pre-operative assessment     Functioning IV was confirmed and monitors were applied. Sterile prep and drape, including hand hygiene and sterile gloves were used. The patient was positioned and the spine was prepped. The skin was anesthetized with lidocaine.  Free flow of clear CSF was obtained prior to injecting local anesthetic into the CSF.  The spinal needle aspirated freely following injection.  The needle was carefully withdrawn.  The patient tolerated the procedure well. Start: [id number] End: 0722 J. Claybon Jabsaniel Singer, MD

## 2014-06-18 NOTE — Evaluation (Addendum)
Occupational Therapy Evaluation Patient Details Name: Danielle Carson MRN: [id number] DOB: Feb 28, 1941 Today's Date: 06/18/2014    History of Present Illness Patient is a 73 yo female admitted 06/18/14 now s/p total hip revision due to recurrent dislocations (3). PMH: Bil THA, HTN    Clinical Impression   Pt s/p above. Education provided during session and pt verbalized understanding. No further OT needs.    Follow Up Recommendations  No OT follow up;Supervision - Intermittent (when OOB/mobility)    Equipment Recommendations  Other (comment) (AE)    Recommendations for Other Services       Precautions / Restrictions Precautions Precautions: Posterior Hip;Fall Precaution Booklet Issued:  (one in room) Precaution Comments: Pt able to state 2/3 precautions. Educated. Restrictions Weight Bearing Restrictions: Yes LLE Weight Bearing: Weight bearing as tolerated      Mobility Bed Mobility Overal bed mobility: Needs Assistance Bed Mobility: Sit to Supine       Sit to supine: Supervision    Transfers Overall transfer level: Needs assistance Equipment used: Rolling walker (2 wheeled) Transfers: Sit to/from Stand Sit to Stand: Min guard         General transfer comment: cues for technique.    Balance                                            ADL Overall ADL's : Needs assistance/impaired     Grooming: Wash/dry hands;Supervision/safety;Standing;Set up               Lower Body Dressing: Minimal assistance;Sit to/from stand;With adaptive equipment   Toilet Transfer: Minimal assistance;Ambulation;RW (3 in 1 over commode)   Toileting- Clothing Manipulation and Hygiene: Minimal assistance;Sitting/lateral lean (clothing-standing)       Functional mobility during ADLs: Minimal assistance;Rolling walker General ADL Comments: Pt states she feels good about AE for LB ADLs-pt did not feel need to practice. Educated/demonstrated shower  transfer technique. Pt has both walk in shower and tub/shower. Reviewed dressing technique. Educated on use of bag on walker, safe shoewear, recommended someone be with her for tub or shower transfer and to sit for most of LB bathing/dressing. Discussed rugs. Pt did not want to practice shower transfer and says she will probably use tub/bench in tub and feels good about this.     Vision                     Perception     Praxis      Pertinent Vitals/Pain Pain 5/10. Repositioned.     Hand Dominance     Extremity/Trunk Assessment Upper Extremity Assessment Upper Extremity Assessment: Overall WFL for tasks assessed   Lower Extremity Assessment Lower Extremity Assessment: Defer to PT evaluation LLE Deficits / Details: Decreased strength and ROM post-op   Cervical / Trunk Assessment Cervical / Trunk Assessment: Normal   Communication Communication Communication: No difficulties   Cognition Arousal/Alertness: Awake/alert Behavior During Therapy: WFL for tasks assessed/performed Overall Cognitive Status: Within Functional Limits for tasks assessed                     General Comments          Shoulder Instructions      Home Living Family/patient expects to be discharged to:: Private residence Living Arrangements: Spouse/significant other Available Help at Discharge: Family;Available 24 hours/day Type of Home: House (townhome) Home Access: Stairs  to enter Entrance Stairs-Number of Steps: 1 Entrance Stairs-Rails: None Home Layout: One level     Bathroom Shower/Tub: Tub/shower unit;Walk-in shower   Bathroom Toilet: Handicapped height     Home Equipment: Bedside commode;Adaptive equipment;Shower seat - built in;Tub Dentistbench Adaptive Equipment: Reacher        Prior Functioning/Environment Level of Independence: Independent             OT Diagnosis:     OT Problem List:     OT Treatment/Interventions:      OT Goals(Current goals can be found  in the care plan section)   OT Frequency:     Barriers to D/C:            Co-evaluation              End of Session Equipment Utilized During Treatment: Gait belt;Rolling walker  Activity Tolerance: Patient tolerated treatment well Patient left: in bed;with call bell/phone within reach   Time: [id number] OT Time Calculation (min): 26 min Charges:  OT General Charges $OT Visit: 1 Procedure OT Evaluation $Initial OT Evaluation Tier I: 1 Procedure OT Treatments $Self Care/Home Management : 8-22 mins G-CodesEarlie Raveling:    Esiquio Boesen L OTR/L [phone] 06/18/2014, 4:32 PM

## 2014-06-18 NOTE — Evaluation (Signed)
Physical Therapy Evaluation Patient Details Name: Danielle Carson MRN: [id number] DOB: 1941/02/04 Today's Date: 06/18/2014   History of Present Illness  Patient is a 73 yo female admitted 06/18/14 now s/p total hip revision due to recurrent dislocations (3).  PMH:  Bil THA, HTN  Clinical Impression  Patient presents with problems listed below.  Will benefit from acute PT to maximize independence prior to discharge home with husband.  Will need to focus on maintaining hip precautions during functional mobility.    Follow Up Recommendations Home health PT;Supervision/Assistance - 24 hour    Equipment Recommendations  None recommended by PT    Recommendations for Other Services       Precautions / Restrictions Precautions Precautions: Posterior Hip;Fall Precaution Booklet Issued: Yes (comment) Precaution Comments: Reviewed precautions with patient. Restrictions Weight Bearing Restrictions: Yes LLE Weight Bearing: Weight bearing as tolerated      Mobility  Bed Mobility Overal bed mobility: Needs Assistance Bed Mobility: Sit to Supine       Sit to supine: Supervision   General bed mobility comments: Patient in recliner as PT entered room.  Transfers Overall transfer level: Needs assistance Equipment used: Rolling walker (2 wheeled) Transfers: Sit to/from Stand Sit to Stand: Min assist         General transfer comment: Verbal cues for hand placement and placement of LLE to maintain precautions.  Assist for balance and safety during transfers from chair and BSC.  Ambulation/Gait Ambulation/Gait assistance: Min assist Ambulation Distance (Feet): 8 Feet Assistive device: Rolling walker (2 wheeled) Gait Pattern/deviations: Step-to pattern;Decreased stance time - left;Decreased step length - right;Decreased stride length;Decreased weight shift to left;Antalgic Gait velocity: Decreased Gait velocity interpretation: Below normal speed for age/gender General Gait  Details: Verbal cues for gait sequence and safe use of RW.  Assist to for balance/safety during gait.  Patient c/o lightheadedness initially upon standing - resolved with time.  Stairs            Wheelchair Mobility    Modified Rankin (Stroke Patients Only)       Balance                                             Pertinent Vitals/Pain Pain 5/10 with mobility.    Home Living Family/patient expects to be discharged to:: Private residence Living Arrangements: Spouse/significant other Available Help at Discharge: Family;Available 24 hours/day Type of Home: House ([facility]) Home Access: Stairs to enter Entrance Stairs-Rails: None Entrance Stairs-Number of Steps: 1 Home Layout: One level Home Equipment: Environmental consultant - 2 wheels;Bedside commode;Shower seat - built in;Tub bench;Cane - single point;Adaptive equipment      Prior Function Level of Independence: Independent               Hand Dominance        Extremity/Trunk Assessment   Upper Extremity Assessment: Overall WFL for tasks assessed           Lower Extremity Assessment: LLE deficits/detail   LLE Deficits / Details: Decreased strength and ROM post-op  Cervical / Trunk Assessment: Normal  Communication   Communication: No difficulties  Cognition Arousal/Alertness: Awake/alert Behavior During Therapy: WFL for tasks assessed/performed Overall Cognitive Status: Within Functional Limits for tasks assessed                      General Comments  Exercises Total Joint Exercises Ankle Circles/Pumps: AROM;Both;10 reps;Seated      Assessment/Plan    PT Assessment Patient needs continued PT services  PT Diagnosis Difficulty walking;Acute pain   PT Problem List Decreased strength;Decreased activity tolerance;Decreased balance;Decreased mobility;Decreased knowledge of use of DME;Decreased knowledge of precautions;Pain  PT Treatment Interventions DME instruction;Gait  training;Stair training;Functional mobility training;Therapeutic exercise;Patient/family education   PT Goals (Current goals can be found in the Care Plan section) Acute Rehab PT Goals Patient Stated Goal: To get stronger to return home PT Goal Formulation: With patient Time For Goal Achievement: 06/25/14 Potential to Achieve Goals: Good    Frequency 7X/week   Barriers to discharge        Co-evaluation               End of Session Equipment Utilized During Treatment: Gait belt Activity Tolerance: Patient limited by pain;Patient limited by fatigue Patient left: in chair;with call bell/phone within reach           Time: 1412-1439 PT Time Calculation (min): 27 min   Charges:   PT Evaluation $Initial PT Evaluation Tier I: 1 Procedure PT Treatments $Therapeutic Activity: 23-37 mins   PT G Codes:          Vena AustriaDavis, Aylen Rambert H 06/18/2014, 4:20 PM Durenda HurtSusan H. Renaldo Fiddleravis, PT, [facility] Acute Rehab Services Pager [phone]

## 2014-06-19 ENCOUNTER — Encounter (HOSPITAL_COMMUNITY): Payer: Self-pay | Admitting: Orthopedic Surgery

## 2014-06-19 LAB — CBC
HCT: 29.6 % — ABNORMAL LOW (ref 36.0–46.0)
Hemoglobin: 10 g/dL — ABNORMAL LOW (ref 12.0–15.0)
MCH: 30.9 pg (ref 26.0–34.0)
MCHC: 33.8 g/dL (ref 30.0–36.0)
MCV: 91.4 fL (ref 78.0–100.0)
PLATELETS: 207 10*3/uL (ref 150–400)
RBC: 3.24 MIL/uL — ABNORMAL LOW (ref 3.87–5.11)
RDW: 14.5 % (ref 11.5–15.5)
WBC: 7.5 10*3/uL (ref 4.0–10.5)

## 2014-06-19 LAB — BASIC METABOLIC PANEL
ANION GAP: 11 (ref 5–15)
BUN: 12 mg/dL (ref 6–23)
CHLORIDE: 99 meq/L (ref 96–112)
CO2: 25 mEq/L (ref 19–32)
CREATININE: 0.94 mg/dL (ref 0.50–1.10)
Calcium: 8.3 mg/dL — ABNORMAL LOW (ref 8.4–10.5)
GFR, EST AFRICAN AMERICAN: 68 mL/min — AB (ref >90)
GFR, EST NON AFRICAN AMERICAN: 58 mL/min — AB (ref >90)
Glucose, Bld: 101 mg/dL — ABNORMAL HIGH (ref 70–99)
Potassium: 4.4 mEq/L (ref 3.7–5.3)
Sodium: 135 mEq/L — ABNORMAL LOW (ref 137–147)

## 2014-06-19 NOTE — Progress Notes (Signed)
Physical Therapy Treatment Patient Details Name: Danielle Carson MRN: [id number] DOB: 07/22/1941 Today's Date: 06/19/2014    History of Present Illness 73 y.o. s/p TOTAL HIP REVISION,placement of constrained  liner and new femoral head (Left)    PT Comments    POD # 1 pm session.  Assisted pt OOB to amb to BR.  Then amb a great distance in hallway.  Returned to room then performed THR standing TE's.  Left pt in bed. Pt a little aprehensive about going home tomorrow. Therapy wise pt is moving great.  Follow Up Recommendations  Home health PT     Equipment Recommendations  None recommended by PT    Recommendations for Other Services       Precautions / Restrictions Precautions Precautions: Posterior Hip;Fall Precaution Comments: Pt able to state 3/3 precautions. Educated. Restrictions Weight Bearing Restrictions: No LLE Weight Bearing: Weight bearing as tolerated    Mobility  Bed Mobility Overal bed mobility: Needs Assistance Bed Mobility: Supine to Sit;Sit to Supine     Supine to sit: Supervision Sit to supine: Supervision   General bed mobility comments: increased time  Transfers Overall transfer level: Needs assistance Equipment used: Rolling walker (2 wheeled) Transfers: Sit to/from Stand Sit to Stand: Supervision         General transfer comment: increased time  Ambulation/Gait Ambulation/Gait assistance: Supervision Ambulation Distance (Feet): 142 Feet Assistive device: Rolling walker (2 wheeled) Gait Pattern/deviations: Step-to pattern;Decreased stance time - left Gait velocity: Decreased   General Gait Details: able to tolerate increased distance.  Increased c/o "tightness" and "soreness".  "Feels better to get up and walk".   Stairs            Wheelchair Mobility    Modified Rankin (Stroke Patients Only)       Balance                                    Cognition                             Exercises  10 peps standing TE's of hip flex, hip ABD, extension and kick backs.    General Comments        Pertinent Vitals/Pain C/o "tightness" "soreness"    Home Living                      Prior Function            PT Goals (current goals can now be found in the care plan section) Progress towards PT goals: Progressing toward goals    Frequency  7X/week    PT Plan      Co-evaluation             End of Session Equipment Utilized During Treatment: Gait belt Activity Tolerance: Patient tolerated treatment well Patient left: in bed;with call bell/phone within reach     Time: 1340-1415 PT Time Calculation (min): 35 min  Charges:  $Gait Training: 8-22 mins $Therapeutic Exercise: 8-22 mins                    G Codes:      Danielle Carson  PTA [facility]  Acute  Rehab Pager      [phone]

## 2014-06-19 NOTE — Progress Notes (Signed)
Utilization review completed.  

## 2014-06-19 NOTE — Progress Notes (Signed)
Patient ID: Danielle Carson, female   DOB: May 23, 1941, 73 y.o.   MRN: [id number] PATIENT ID: Danielle Carson  MRN: [id number]  DOB/AGE:  May 23, 1941 / 73 y.o.  1 Day Post-Op Procedure(s) (LRB): TOTAL HIP REVISION,placement of constrained  liner and new femoral head (Left)    PROGRESS NOTE Subjective: Patient is alert, oriented, no Nausea, no Vomiting, yes passing gas, no Bowel Movement. Taking PO well. Denies SOB, Chest or Calf Pain. Using Incentive Spirometer, PAS in place. Ambulate WBAT Patient reports pain as 2 on 0-10 scale  .    Objective: Vital signs in last 24 hours: Filed Vitals:   06/18/14 1026 06/18/14 1049 06/18/14 2111 06/19/14 0142  BP:  133/63 106/50 115/61  Pulse: 63 61 69 78  Temp: 97.6 F (36.4 C) 97.5 F (36.4 C) 97.6 F (36.4 C) 98.1 F (36.7 C)  TempSrc:  Oral Oral Oral  Resp: 21 20 18 18   SpO2: 100% 100% 98% 98%      Intake/Output from previous day: I/O last 3 completed shifts: In: 1618.8 [I.V.:1618.8] Out: 200 [Blood:200]   Intake/Output this shift:     LABORATORY DATA:  Recent Labs  06/19/14 0549  WBC 7.5  HGB 10.0*  HCT 29.6*  PLT 207  NA 135*  K 4.4  CL 99  CO2 25  BUN 12  CREATININE 0.94  GLUCOSE 101*  CALCIUM 8.3*    Examination: Neurologically intact ABD soft Neurovascular intact Sensation intact distally Intact pulses distally Dorsiflexion/Plantar flexion intact Incision: scant drainage No cellulitis present Compartment soft} XR AP&Lat of hip shows well placed\fixed THA  Assessment:   1 Day Post-Op Procedure(s) (LRB): TOTAL HIP REVISION,placement of constrained  liner and new femoral head (Left) ADDITIONAL DIAGNOSIS:  Hypertension  Plan: PT/OT WBAT, THA  posterior precautions  DVT Prophylaxis: SCDx72 hrs, ASA 325 mg BID x 2 weeks  DISCHARGE PLAN: Home, probably tomorrow after passing PT  DISCHARGE NEEDS: HHPT, HHRN, CPM, Walker and 3-in-1 comode seat

## 2014-06-19 NOTE — Progress Notes (Signed)
Physical Therapy Treatment Patient Details Name: Danielle Carson MRN: [id number] DOB: 03/20/1941 Today's Date: 06/19/2014    History of Present Illness 73 y.o. s/p TOTAL HIP REVISION,placement of constrained  liner and new femoral head (Left)    PT Comments    POD # 1 am session pt OOB in recliner with spouse in room.  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  Pt aware of her hip precautions and  WBAT.  Assisted pt from recliner to bathroom then amb in hallway.  Pt stated she was "feeling better" and reported NO "lightheadedness" today.  Assisted pt back to bed to perform THR TE's.  Assisted OOB back to recliner per pt request followed by ICE.  Follow Up Recommendations  Home health PT     Equipment Recommendations  None recommended by PT (has all equipment from previous)    Recommendations for Other Services       Precautions / Restrictions Precautions Precautions: Posterior Hip;Fall Precaution Comments: Pt able to state 3/3 precautions. Educated. Restrictions Weight Bearing Restrictions: Yes LLE Weight Bearing: Weight bearing as tolerated    Mobility  Bed Mobility               General bed mobility comments: Patient in recliner as PT entered room.  Transfers Overall transfer level: Needs assistance Equipment used: Rolling walker (2 wheeled) Transfers: Sit to/from Stand Sit to Stand: Supervision;Min guard         General transfer comment: increased time and one VC to extend L LE prior to sit  Ambulation/Gait Ambulation/Gait assistance: Min guard;Min assist Ambulation Distance (Feet): 35 Feet Assistive device: Rolling walker (2 wheeled) Gait Pattern/deviations: Step-to pattern;Decreased stance time - left Gait velocity: Decreased   General Gait Details: increased time and <25% VC's on safety with turns and backward gait.  No c/o lightheadedness today "feeling better". Tolerated amb to bathroom and in hallway.     Stairs            Wheelchair Mobility    Modified Rankin (Stroke Patients Only)       Balance                                    Cognition                            Exercises   Total Hip Replacement TE's 10 reps ankle pumps 10 reps knee presses 10 reps heel slides 10 reps SAQ's 10 reps ABD Followed by ICE     General Comments        Pertinent Vitals/Pain C/0 "soreness" Pre medicated ICE applied    Home Living                      Prior Function            PT Goals (current goals can now be found in the care plan section) Progress towards PT goals: Progressing toward goals    Frequency  7X/week    PT Plan      Co-evaluation             End of Session Equipment Utilized During Treatment: Gait belt Activity Tolerance: Patient tolerated treatment well Patient left: in chair;with call bell/phone within reach;with family/visitor present     Time: 0935-1000 PT Time Calculation (min): 25 min  Charges:  $  Gait Training: 8-22 mins $Therapeutic Exercise: 8-22 mins                    G Codes:      Rica Koyanagi  PTA [facility]  Acute  Rehab Pager      [phone]

## 2014-06-19 NOTE — Care Management Note (Addendum)
CARE MANAGEMENT NOTE 06/19/2014  Patient:  Danielle Carson,Danielle Carson   Account Number:  [id number]  Date Initiated:  06/19/2014  Documentation initiated by:  Vance PeperBRADY,[place]  Subjective/Objective Assessment:   73 yr old female s/p left hip hip revision     Action/Plan:   Case manager spoke with patient concerning home health and DME needs. Patient preoperatively setup with [facility], no changes. Has rolling walker and 3in1. Patient has family support at discharge.   Anticipated DC Date:  06/19/2014   Anticipated DC Plan:  HOME W HOME HEALTH SERVICES      DC Planning Services  CM consult      [facility]   Choice offered to / List presented to:  C-1 Patient   DME arranged  NA        HH arranged  HH-2 PT      HH agency  [facility]   Status of service:  Completed, signed off Medicare Important Message given?  NA - LOS <3 / Initial given by admissions (If response is "NO", the following Medicare IM given date fields will be blank) Date Medicare IM given:   Medicare IM given by:   Date Additional Medicare IM given:   Additional Medicare IM given by:    Discharge Disposition:  HOME W HOME HEALTH SERVICES  Per UR Regulation:  Reviewed for med. necessity/level of care/duration of stay

## 2014-06-20 LAB — CBC
HCT: 27 % — ABNORMAL LOW (ref 36.0–46.0)
HEMOGLOBIN: 9.2 g/dL — AB (ref 12.0–15.0)
MCH: 31 pg (ref 26.0–34.0)
MCHC: 34.1 g/dL (ref 30.0–36.0)
MCV: 90.9 fL (ref 78.0–100.0)
Platelets: 197 10*3/uL (ref 150–400)
RBC: 2.97 MIL/uL — ABNORMAL LOW (ref 3.87–5.11)
RDW: 14.4 % (ref 11.5–15.5)
WBC: 5.6 10*3/uL (ref 4.0–10.5)

## 2014-06-20 NOTE — Progress Notes (Signed)
Physical Therapy Treatment Patient Details Name: Jennye Boroughsatricia Fairfield-Artman MRN: [id number] DOB: 07-17-1941 Today's Date: 06/20/2014    History of Present Illness 73 y.o. s/p TOTAL HIP REVISION,placement of constrained  liner and new femoral head (Left)    PT Comments    Pt was very anxious and eager to get up and move around. Pt had very good understanding of exercises and stair training. Pt was able to amb much further today and did not c/o any pain or tightness. Pt is planning to discharge this afternoon home with husband.   Follow Up Recommendations  Home health PT     Equipment Recommendations       Recommendations for Other Services       Precautions / Restrictions Precautions Precautions: Posterior Hip;Fall    Mobility  Bed Mobility               General bed mobility comments: pt in recliner before and after treatment.  Transfers Overall transfer level: Modified independent                  Ambulation/Gait Ambulation/Gait assistance: Supervision Ambulation Distance (Feet): 600 Feet Assistive device: Rolling walker (2 wheeled) Gait Pattern/deviations: Step-through pattern;Decreased stride length Gait velocity: increasing Gait velocity interpretation: Below normal speed for age/gender General Gait Details: able to tolerate increased distance with no pain. "feels better to get up and move". pt has started to amb with slow step-through pattern.   Stairs Stairs: Yes Stairs assistance: Supervision Stair Management: No rails Number of Stairs: 1 General stair comments: pt was able to complete stair training successfully, only with the beginning instruction.   Wheelchair Mobility    Modified Rankin (Stroke Patients Only)       Balance                                    Cognition Arousal/Alertness: Awake/alert Behavior During Therapy: WFL for tasks assessed/performed Overall Cognitive Status: Within Functional Limits for tasks  assessed                      Exercises Total Joint Exercises Hip ABduction/ADduction: AROM;15 reps;Left;Standing Knee Flexion: AROM;15 reps;Left;Standing Marching in Standing: AROM;Both;15 reps;Standing Standing Hip Extension: AROM;15 reps;Standing;Left    General Comments        Pertinent Vitals/Pain no apparent distress. Pt repositioned in recliner for comfort.      Home Living                      Prior Function            PT Goals (current goals can now be found in the care plan section) Progress towards PT goals: Progressing toward goals    Frequency  7X/week    PT Plan Current plan remains appropriate    Co-evaluation             End of Session Equipment Utilized During Treatment: Gait belt Activity Tolerance: Patient tolerated treatment well Patient left: in chair;with call bell/phone within reach     Time: [id number] PT Time Calculation (min): 20 min  Charges:                       G Codes:      BRASFIELD,Tanaiya Kolarik,SPTA 06/20/2014, 9:10 AM

## 2014-06-20 NOTE — Discharge Summary (Signed)
Patient ID: Danielle Carson MRN: [id number] DOB/AGE: 06/11/1941 73 y.o.  Admit date: 06/18/2014 Discharge date: 06/20/2014  Admission Diagnoses:  Active Problems:   S/P revision of total hip   Discharge Diagnoses:  Same  Past Medical History  Diagnosis Date  . Hiatal hernia   . Diverticulosis   . Trapezius muscle spasm 10/04/2013  . Complication of anesthesia   . PONV (postoperative nausea and vomiting)   . Hypertension   . Phlebitis     postpartum- 50+ yrs.   . Osteoporosis   . Arthritis     cervical DDD- seen on CAT & R knee    Surgeries: Procedure(s): TOTAL HIP REVISION,placement of constrained  liner and new femoral head on 06/18/2014   Consultants:    Discharged Condition: Improved  Hospital Course: Danielle Carson is an 73 y.o. female who was admitted 06/18/2014 for operative treatment of<principal problem not specified>. Patient has severe unremitting pain that affects sleep, daily activities, and work/hobbies. After pre-op clearance the patient was taken to the operating room on 06/18/2014 and underwent  Procedure(s): TOTAL HIP REVISION,placement of constrained  liner and new femoral head.    Patient was given perioperative antibiotics: Anti-infectives   Start     Dose/Rate Route Frequency Ordered Stop   06/18/14 0600  ceFAZolin (ANCEF) IVPB 2 g/50 mL premix     2 g 100 mL/hr over 30 Minutes Intravenous On call to O.R. 06/17/14 1317 06/18/14 0800       Patient was given sequential compression devices, early ambulation, and chemoprophylaxis to prevent DVT.  Patient benefited maximally from hospital stay and there were no complications.    Recent vital signs: Patient Vitals for the past 24 hrs:  BP Temp Temp src Pulse Resp SpO2 Height Weight  06/20/14 0533 123/55 mmHg 98 F (36.7 C) Oral 63 18 97 % - -  06/20/14 0400 - - - - 16 - - -  06/20/14 0300 - - - - - - 5' 8.27" (1.734 m) 69.536 kg (153 lb 4.8 oz)  06/19/14 2344 - - - - 17 - - -   06/19/14 2115 114/58 mmHg 97.9 F (36.6 C) Oral 67 16 100 % - -  06/19/14 2000 - - - - 16 - - -  06/19/14 1400 105/49 mmHg 97.8 F (36.6 C) - 69 16 100 % - -  06/19/14 0800 - - - - 18 98 % - -     Recent laboratory studies:  Recent Labs  06/19/14 0549 06/20/14 0605  WBC 7.5 5.6  HGB 10.0* 9.2*  HCT 29.6* 27.0*  PLT 207 197  NA 135*  --   K 4.4  --   CL 99  --   CO2 25  --   BUN 12  --   CREATININE 0.94  --   GLUCOSE 101*  --   CALCIUM 8.3*  --      Discharge Medications:     Medication List    STOP taking these medications       aspirin 81 MG tablet  Replaced by:  aspirin EC 325 MG tablet      TAKE these medications       aspirin EC 325 MG tablet  Take 1 tablet (325 mg total) by mouth 2 (two) times daily.     atorvastatin 10 MG tablet  Commonly known as:  LIPITOR  Take 1 tablet (10 mg total) by mouth daily.     calcium-vitamin D 500-200 MG-UNIT per tablet  Commonly known  as:  OSCAL WITH D  Take 2 tablets by mouth.     glucosamine-chondroitin 500-400 MG tablet  Take 1 tablet by mouth 3 (three) times daily. OTC     ibuprofen 200 MG tablet  Commonly known as:  ADVIL,MOTRIN  Take 400 mg by mouth every 6 (six) hours as needed.     losartan 50 MG tablet  Commonly known as:  COZAAR  Take 50 mg by mouth daily.     losartan 50 MG tablet  Commonly known as:  COZAAR  TAKE 1 TABLET BY MOUTH EVERY DAY     Melatonin 1 MG Tabs  Take 1 mg by mouth at bedtime.     oxyCODONE-acetaminophen 5-325 MG per tablet  Commonly known as:  ROXICET  Take 1 tablet by mouth every 4 (four) hours as needed.     tizanidine 2 MG capsule  Commonly known as:  ZANAFLEX  Take 1 capsule (2 mg total) by mouth 3 (three) times daily.        Diagnostic Studies: Dg Chest 2 View  06/11/2014   CLINICAL DATA:  Pre operative respiratory exam for left hip arthroplasty revision. History of hypertension.  EXAM: CHEST - 2 VIEW  COMPARISON:  11/11/2010  FINDINGS: The heart size and  mediastinal contours are within normal limits. Stable mild hyperinflation. There is no evidence of pulmonary edema, consolidation, pneumothorax, nodule or pleural fluid. Stable mild rightward convex scoliosis of the thoracic spine.  IMPRESSION: No active disease.   Electronically Signed   By: Irish Lack M.D.   On: 06/11/2014 10:28   Dg Pelvis Portable  06/18/2014   CLINICAL DATA:  Status post left hip replacement  EXAM: PORTABLE PELVIS 1-2 VIEWS  COMPARISON:  01/24/2014  FINDINGS: Revision of left hip prosthesis is noted. No acute abnormality is seen. No acute fractures noted. Air is noted within the surgical bed.  IMPRESSION: No acute abnormality following hip prosthesis revision.   Electronically Signed   By: Alcide Clever M.D.   On: 06/18/2014 09:46    Disposition: 01-Home or Self Care      Discharge Instructions   Call MD / Call 911    Complete by:  As directed   If you experience chest pain or shortness of breath, CALL 911 and be transported to the hospital emergency room.  If you develope a fever above 101 F, pus (white drainage) or increased drainage or redness at the wound, or calf pain, call your surgeon's office.     Change dressing    Complete by:  As directed   You may change your dressing on day 5, then change the dressing daily with sterile 4 x 4 inch gauze dressing and paper tape.  You may clean the incision with alcohol prior to redressing     Constipation Prevention    Complete by:  As directed   Drink plenty of fluids.  Prune juice may be helpful.  You may use a stool softener, such as Colace (over the counter) 100 mg twice a day.  Use MiraLax (over the counter) for constipation as needed.     Diet - low sodium heart healthy    Complete by:  As directed      Discharge instructions    Complete by:  As directed   Follow up in office with Dr. [name] in 2 weeks.     Driving restrictions    Complete by:  As directed   No driving for 2 weeks  Follow the hip precautions  as taught in Physical Therapy    Complete by:  As directed      Increase activity slowly as tolerated    Complete by:  As directed      Patient may shower    Complete by:  As directed   You may shower without a dressing once there is no drainage.  Do not wash over the wound.  If drainage remains, cover wound with plastic wrap and then shower.           Follow-up Information   Follow up with [name],FRANK J, MD In 2 weeks.   Specialty:  Orthopedic Surgery   Contact information:   [address] [place] [address] [phone]       Follow up with [facility]. (Someone from [facility] will contact you concerning start date and time for physical therapy.)    Contact information:   [address] [place] [address] [phone]       Follow up with [name],FRANK J, MD In 2 weeks.   Specialty:  Orthopedic Surgery   Contact information:   [address] [place] [address] [phone]        Signed: Vear ClockHILLIPS, Alieyah Spader R 06/20/2014, 7:55 AM

## 2014-06-20 NOTE — Progress Notes (Signed)
PATIENT ID: Danielle Carson  MRN: [id number]  DOB/AGE:  Nov 19, 1941 / 73 y.o.  2 Days Post-Op Procedure(s) (LRB): TOTAL HIP REVISION,placement of constrained  liner and new femoral head (Left)    PROGRESS NOTE Subjective: Patient is alert, oriented, no Nausea, no Vomiting, yes passing gas, no Bowel Movement. Taking PO well. Denies SOB, Chest or Calf Pain. Using Incentive Spirometer, PAS in place. Ambulate wbat Patient reports pain as 5 on 0-10 scale  .    Objective: Vital signs in last 24 hours: Filed Vitals:   06/19/14 2344 06/20/14 0300 06/20/14 0400 06/20/14 0533  BP:    123/55  Pulse:    63  Temp:    98 F (36.7 C)  TempSrc:    Oral  Resp: 17  16 18   Height:  5' 8.27" (1.734 m)    Weight:  69.536 kg (153 lb 4.8 oz)    SpO2:    97%      Intake/Output from previous day: I/O last 3 completed shifts: In: 1837.3 [P.O.:1560; I.V.:277.3] Out: -    Intake/Output this shift:     LABORATORY DATA:  Recent Labs  06/19/14 0549 06/20/14 0605  WBC 7.5 5.6  HGB 10.0* 9.2*  HCT 29.6* 27.0*  PLT 207 197  NA 135*  --   K 4.4  --   CL 99  --   CO2 25  --   BUN 12  --   CREATININE 0.94  --   GLUCOSE 101*  --   CALCIUM 8.3*  --     Examination: Neurologically intact Neurovascular intact Sensation intact distally Intact pulses distally Dorsiflexion/Plantar flexion intact Incision: scant drainage No cellulitis present Compartment soft} XR AP&Lat of hip shows well placed\fixed THA  Assessment:   2 Days Post-Op Procedure(s) (LRB): TOTAL HIP REVISION,placement of constrained  liner and new femoral head (Left) ADDITIONAL DIAGNOSIS:  Hypertension  Plan: PT/OT WBAT, THA  posterior precautions  DVT Prophylaxis: SCDx72 hrs, ASA 325 mg BID x 2 weeks  DISCHARGE PLAN: Home  DISCHARGE NEEDS: HHPT, HHRN, Walker and 3-in-1 comode seat

## 2014-06-20 NOTE — Progress Notes (Signed)
Seen and agreed 06/20/2014 Robinette, Julia Elizabeth PTA [phone] pager [phone] office    

## 2014-06-21 LAB — BODY FLUID CULTURE
CULTURE: NO GROWTH
Gram Stain: NONE SEEN

## 2014-06-23 LAB — ANAEROBIC CULTURE: Gram Stain: NONE SEEN

## 2014-08-03 ENCOUNTER — Other Ambulatory Visit: Payer: Self-pay | Admitting: "Endocrinology

## 2014-09-13 ENCOUNTER — Other Ambulatory Visit: Payer: Self-pay | Admitting: "Endocrinology

## 2014-09-24 ENCOUNTER — Other Ambulatory Visit: Payer: Self-pay | Admitting: *Deleted

## 2014-09-24 DIAGNOSIS — I10 Essential (primary) hypertension: Secondary | ICD-10-CM

## 2014-09-24 MED ORDER — LOSARTAN POTASSIUM 50 MG PO TABS: ORAL_TABLET | ORAL | Status: DC

## 2014-10-01 ENCOUNTER — Telehealth: Payer: Self-pay | Admitting: "Endocrinology

## 2014-10-01 ENCOUNTER — Other Ambulatory Visit: Payer: Self-pay | Admitting: *Deleted

## 2014-10-01 DIAGNOSIS — R946 Abnormal results of thyroid function studies: Secondary | ICD-10-CM

## 2014-10-01 NOTE — Telephone Encounter (Signed)
Labs placed in portal. KW 

## 2014-10-02 LAB — COMPREHENSIVE METABOLIC PANEL
ALK PHOS: 67 U/L (ref 39–117)
ALT: 18 U/L (ref 0–35)
AST: 22 U/L (ref 0–37)
Albumin: 4.5 g/dL (ref 3.5–5.2)
BILIRUBIN TOTAL: 0.4 mg/dL (ref 0.2–1.2)
BUN: 17 mg/dL (ref 6–23)
CO2: 27 mEq/L (ref 19–32)
CREATININE: 1.16 mg/dL — AB (ref 0.50–1.10)
Calcium: 9.3 mg/dL (ref 8.4–10.5)
Chloride: 99 mEq/L (ref 96–112)
GLUCOSE: 93 mg/dL (ref 70–99)
Potassium: 4.3 mEq/L (ref 3.5–5.3)
Sodium: 135 mEq/L (ref 135–145)
Total Protein: 7.6 g/dL (ref 6.0–8.3)

## 2014-10-02 LAB — PTH, INTACT AND CALCIUM
CALCIUM: 9.3 mg/dL (ref 8.4–10.5)
PTH: 47 pg/mL (ref 14–64)

## 2014-10-02 LAB — VITAMIN D 25 HYDROXY (VIT D DEFICIENCY, FRACTURES): Vit D, 25-Hydroxy: 62 ng/mL (ref 30–89)

## 2014-10-02 LAB — TSH: TSH: 2.6 u[IU]/mL (ref 0.350–4.500)

## 2014-10-02 LAB — CALCIUM: Calcium: 9.3 mg/dL (ref 8.4–10.5)

## 2014-10-02 LAB — T3, FREE: T3 FREE: 2.8 pg/mL (ref 2.3–4.2)

## 2014-10-02 LAB — T4, FREE: FREE T4: 1.14 ng/dL (ref 0.80–1.80)

## 2014-10-03 ENCOUNTER — Encounter: Payer: Self-pay | Admitting: "Endocrinology

## 2014-10-03 ENCOUNTER — Ambulatory Visit (INDEPENDENT_AMBULATORY_CARE_PROVIDER_SITE_OTHER): Payer: BC Managed Care – PPO | Admitting: "Endocrinology

## 2014-10-03 VITALS — BP 135/72 | HR 59 | Wt 153.0 lb

## 2014-10-03 DIAGNOSIS — I10 Essential (primary) hypertension: Secondary | ICD-10-CM | POA: Insufficient documentation

## 2014-10-03 DIAGNOSIS — E559 Vitamin D deficiency, unspecified: Secondary | ICD-10-CM | POA: Diagnosis not present

## 2014-10-03 DIAGNOSIS — E211 Secondary hyperparathyroidism, not elsewhere classified: Secondary | ICD-10-CM | POA: Diagnosis not present

## 2014-10-03 DIAGNOSIS — Z23 Encounter for immunization: Secondary | ICD-10-CM | POA: Diagnosis not present

## 2014-10-03 DIAGNOSIS — E049 Nontoxic goiter, unspecified: Secondary | ICD-10-CM

## 2014-10-03 DIAGNOSIS — M6248 Contracture of muscle, other site: Secondary | ICD-10-CM

## 2014-10-03 DIAGNOSIS — M810 Age-related osteoporosis without current pathological fracture: Secondary | ICD-10-CM | POA: Diagnosis not present

## 2014-10-03 DIAGNOSIS — E063 Autoimmune thyroiditis: Secondary | ICD-10-CM

## 2014-10-03 DIAGNOSIS — M62838 Other muscle spasm: Secondary | ICD-10-CM

## 2014-10-03 DIAGNOSIS — R609 Edema, unspecified: Secondary | ICD-10-CM

## 2014-10-03 MED ORDER — LOSARTAN POTASSIUM 50 MG PO TABS: ORAL_TABLET | ORAL | Status: AC

## 2014-10-03 NOTE — Progress Notes (Signed)
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 She was unaccompanied for today's visit.   1. The patient was referred to me on 07/15/2005 for evaluation of osteoporosis by her former primary care provider, Dr. [name], M.D.   A. The patient developed bilateral hip joint pains in the late 1990s. In 1996, Dr. [name] of [facility], ordered a bone mineral density study. That study showed significant osteoporosis in the spine and in both hips.  The patient was treated with calcium carbonate and with a multivitamin that included vitamin D. Despite taking these medications and having a reasonably robust exercise regimen, the patient's osteoporosis worsened. In 1999 Dr. [name]  performed bilateral hip replacements. Treatment with Fosamax was begun at about that time. In the intervening years bone mineral density had not changed significantly. She was treated with Boniva for several months, but just forgot to take it. Her past medical history revealed that she had had arthritis in many joints for many years. She had undergone natural menopause about 20 years prior. She did take estrogen for one or two years, then stopped that medication. Her surgeries included bilateral hip replacements and bilateral vein stripping. 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 Family history revealed that 5 of her 6 siblings had osteoporosis. Two of those had celiac disease when younger. A sister and brother had thyroid problems as well. [Addendum 04/04/13: At that time all 6 of her siblings were hypothyroid and took thyroid hormone. One brother became hypothyroid after radioactive iodine therapy. A niece has been diagnosed with Graves' disease.]  B. Her physical examination was  completely unremarkable. Laboratory data revealed that her PTH was 75.5, which was at the upper limit of normal. Her calcium was 8.7, which was in the lower 15% of the normal range. Her 25-hydroxy vitamin D was 39 which was normal. Her 1,25 vitamin D was 51 which was also normal. I felt that she had relative hyperparathyroidism due to relatively low calcium and vitamin D intake. I asked her to increase her calcium and vitamin D intake. On subsequent testing on 09/30/2005, her PTH was 37.0, calcium 9.3, 25-vitamin D 47, and 1,25-vitamin D 55.   2. During the past nine years the patient has done well overall. Because it was unclear how long she should remain on Fosamax treatment, I recommended that she obtain a second opinion from Dr. Gwendolyn Grant at [facility]. She saw Dr. Valarie Cones in June of 2009. He recommended that she not take either Fosamax or Boniva treatments. He felt that she had derived all of the benefit from Fosamax that she could. He recommended that she continue her calcium, vitamin D, and exercise. Since then she has had a left hip revision procedure performed.  She continued to have follow-up visits with Dr. [name] in orthopedics. A bone mineral density study performed on 02/16/2011 showed that her bone mineral density probably declined about 2.4-3.7% in the last 3 years. Since that study she has also had three hip dislocation procedures performed.  3.The patient's last [facility] visit was on 04/04/13. In the interim she dislocated her left hip yet again and had surgery in July. She has recovered well.  She has not had any further diverticulitis. At last visit she had a new lump in her left anterior neck. CT scan in  February showed that this was the left submandibular gland. Since last visit the lump has "gone away". Pre-operatively she had a cardiac stress test in May and did well. She tolerates her losartan very well. She remains on calcium with vitamin D twice  daily (600 mg of Caltrate D twice daily). She has not had much leg edema recently. Otherwise she has been pretty healthy and has been doing well. She walks regularly without any limitation. She has been having more calf cramps during the nights times. One episode occurred after wearing high heels. The calf cramps often sem linked to times when her fluid intake is significantly decreased and she has been physically active.   4. Pertinent Review of Systems: Constitutional: The patient feels "good", is healthy, and has no significant complaints. She still has frequent trapezius and nuchal tension, especially when she spends a lot of time at her computer..  Eyes: Vision is good with her glasses. There are no significant eye complaints. Neck: The patient has no other complaints of anterior neck swelling, soreness, tenderness,  pressure, discomfort, or difficulty swallowing.  Heart: Heart rate increases with exercise or other physical activity. The patient has no complaints of palpitations, irregular heat beats, chest pain, or chest pressure. Gastrointestinal: She still takes a probiotic to control her reflux. She notes reflux if she eats late and eats rapidly. Bowel movents seem normal. The patient has no complaints of excessive hunger, acid reflux, upset stomach, stomach aches or pains, diarrhea, or constipation. Legs: Muscle mass and strength seem normal. There are no complaints of numbness, tingling, or burning. She has occasional leg muscle pains after walking. She has more frequent edema if she sits too long.  Feet: There are no obvious foot problems, except for arthritis in her toes. There are no complaints of numbness, tingling, burning, or pain. No edema is noted.  PAST MEDICAL, FAMILY, AND SOCIAL HISTORY: 1. Work and family: Her son is a Midwife in the [employer] who will retire this June. 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  2. Activities: She is active physically. She tries to walk  about three times per week. She also uses a stair-stepper frequently.   3. Primary care provider: She now sees Dr. Etta Grandchild at [place]. 4. Orthopedist: Dr. [name]  REVIEW OF SYSTEMS: There are no other significant problems involving her other body systems.  PHYSICAL EXAM: BP 135/72  Pulse 59  Wt 153 lb (69.4 kg)   Constitutional: The patient looks healthy, physically and emotionally well, but somewhat slimmer over time.  Her weight is unchanged. She is alert, bright, and very mentally sharp.  Eyes: There is no arcus or proptosis.  Mouth: The oropharynx appears normal. The tongue appears normal. There is normal oral moisture. There is no obvious gingivitis. Neck: There are no bruits present. The thyroid gland appears normal in size. The thyroid gland is slightly enlarged today at 20-21 grams. The right lobe is within normal limits for size. The left loge is enlarged.  The consistency of the thyroid gland is normal on the right, but slightly full on the left. . There is thyroid tenderness to palpation today in her left mid-lobe. Her trapezius muscles are tight and tender. Her nuchal cords are tight.  The left submandibular gland is somewhat larger and firmer than the right, but down to within normal limits for size There are no supraclavicular masses or palpable lymph nodes.  Lungs: The lungs are clear. Air movement is good.  Heart: The heart rhythm and rate appear normal. Heart sounds S1 and S2 are normal. I do not appreciate any pathologic heart murmurs. Abdomen: The abdomen is somewhat enlarged in size, but appropriate for age. Bowel sounds are normal. The abdomen is soft and non-tender. There is no obviously palpable hepatomegaly, splenomegaly, or other masses.  Arms: Muscle mass appears appropriate for age.  Hands: There is no obvious tremor. Phalangeal and metacarpophalangeal joints appear normal. Palms are normal. Legs: Muscle mass appears appropriate for age. There is trace edema  bilaterally.  Neurologic: Muscle strength is normal for age and gender  in both the upper and the lower extremities. Muscle tone appears normal. Sensation to touch is normal in the legs.   Labs:   Labs 10/01/14: PTH 47, calcium 9.3, 25-hydroxy vitamin D 62, CMP with creatinine 1.16; TSH 2.60, free T4 1.14, 2.8  Labs 04/02/14: PTH 43.8, calcium 9.3, 25-hydroxy vitamin D 54, sodium 134; TSH 3.197, free T4 1.05, free T3 2.4  Labs 09/22/33: PTH 34, calcium 9.1, 25-hydroxy vitamin D 58, alkaline phosphatase 54, sodium 134, TSH 1.869, free T4 1.11, free T3 2.4  Labs 02/28/13: PTH 70.5, calcium 9.3, 25-hydroxy vitamin D 50, 1,25-dihydroxy vitamin D 64,   Labs 09/23/12: PTH 34.6, calcium 9.4, 25-hydroxy vitamin D 49, 1,25-dihydroxy vitamin D 53, TSH 1.767, free T4 1.21, free T3 2.6  Labs 03/24/12: PTH 15.3, calcium 9.8, 25-hydroxy vitamin D 56, 1.25-dihydroxy vitamin D 40, TSH 1.254, free T4 1.29, free T3 2.5.   03/29/13 Dexa scan: The patient's bone mineral density study performed showed a 2.6% increase of BMD in the spine, but a 9.4% decrease in the wrist.   ASSESSMENT: 1. Osteoporosis: The patient's osteoporosis was slightly better in the spine, but worse in the wrist in April 2014. Since her serum calcium is still below the 50%. I've asked her to take her calcium at breakfast and at dinner. She can also take Tums at bedtime if she needs to without worrying about developing hypercalcemia.  We do not want her PTH to increase to the point that the PTH pulls calcium out of bone.  2. Vitamin D deficiency: Vitamin D levels are currently quite good. 3. Secondary hyperparathyroidism: Her parathyroid hormone levels are normal, but a bit higher. I would like to see her calcium in the upper quartile of the normal range and her PTH in the lower half of the normal range, more around a level of 40.   4. Goiter/thyroiditis: Thyroid size is enlarged today and tender. The waxing and waning of thyroid gland size and  the tenderness are both c/w a flare up of Hashimoto's Dz. It is likely that she will eventually need to be treated with thyroid hormone. 5. Hypocalcemia: Her serum calcium has increased slightly, but is still below the 50%. I would still like to see it higher over time to provide her adequate amounts of calcium to foster better calcium uptake by bone. 6. Hypertension: Her BP is fine on her current losartan dose.  7. Pedal edema: She has only a bit of edema today.  8. Toe pain: resolved 9. 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 Massage therapy helps. Cervical stretching exercises will also help. 10. Neck lump: The CT scan indicated that the lump in the left anterior aspect of her neck was an enlarged submandibular gland. That gland has now shrink down to normal size. There is no need for further evaluation at this time.   PLAN: 1. Diagnostic: Will repeat her  PTH, calcium, 25-vitamin D, TFTs and CMP at next visit.  2. Therapeutic: Continue losartan at 50 mg tablet each AM. Continue to exercise..Change timing of calcium tablets to breakfast and dinner.  Watch salt intake. 3. Patient education: We discussed the current recommendations from bone mineral density specialists as to calcium and vitamin D intake. I've asked her to try to increase the amount of calcium which she obtains from natural sources, such as milk, but also to take high quality calcium supplements such as Tums, Oscal, and Citracal. We also discussed the issues of hypertension, edema, goiter, and thyroiditis at length.  4. Follow-up: The patient will have a follow-up appointment in 6 months.  Level of Service: This visit lasted in excess of 50 minutes. More than 50% of the visit was devoted to counseling.  David StallBRENNAN,Olvin Rohr J

## 2014-10-03 NOTE — Patient Instructions (Signed)
Follow up visit in 6 months. Please have labs drawn one week prior.

## 2014-10-17 ENCOUNTER — Other Ambulatory Visit (INDEPENDENT_AMBULATORY_CARE_PROVIDER_SITE_OTHER): Payer: BC Managed Care – PPO

## 2014-10-17 ENCOUNTER — Encounter: Payer: Self-pay | Admitting: Internal Medicine

## 2014-10-17 ENCOUNTER — Ambulatory Visit (INDEPENDENT_AMBULATORY_CARE_PROVIDER_SITE_OTHER): Payer: BC Managed Care – PPO | Admitting: Internal Medicine

## 2014-10-17 VITALS — BP 124/70 | HR 58 | Temp 97.9°F | Resp 16 | Ht 68.25 in | Wt 156.0 lb

## 2014-10-17 DIAGNOSIS — E785 Hyperlipidemia, unspecified: Secondary | ICD-10-CM

## 2014-10-17 DIAGNOSIS — D62 Acute posthemorrhagic anemia: Secondary | ICD-10-CM

## 2014-10-17 DIAGNOSIS — I10 Essential (primary) hypertension: Secondary | ICD-10-CM

## 2014-10-17 LAB — CBC WITH DIFFERENTIAL/PLATELET
Basophils Absolute: 0 10*3/uL (ref 0.0–0.1)
Basophils Relative: 0.9 % (ref 0.0–3.0)
EOS PCT: 2.2 % (ref 0.0–5.0)
Eosinophils Absolute: 0.1 10*3/uL (ref 0.0–0.7)
HEMATOCRIT: 39.5 % (ref 36.0–46.0)
HEMOGLOBIN: 13.2 g/dL (ref 12.0–15.0)
LYMPHS ABS: 1.1 10*3/uL (ref 0.7–4.0)
LYMPHS PCT: 24.5 % (ref 12.0–46.0)
MCHC: 33.3 g/dL (ref 30.0–36.0)
MCV: 89.9 fl (ref 78.0–100.0)
MONOS PCT: 12.6 % — AB (ref 3.0–12.0)
Monocytes Absolute: 0.6 10*3/uL (ref 0.1–1.0)
NEUTROS ABS: 2.7 10*3/uL (ref 1.4–7.7)
Neutrophils Relative %: 59.8 % (ref 43.0–77.0)
Platelets: 241 10*3/uL (ref 150.0–400.0)
RBC: 4.39 Mil/uL (ref 3.87–5.11)
RDW: 14.3 % (ref 11.5–15.5)
WBC: 4.5 10*3/uL (ref 4.0–10.5)

## 2014-10-17 MED ORDER — ATORVASTATIN CALCIUM 10 MG PO TABS: 10.0000 mg | ORAL_TABLET | Freq: Every day | ORAL | Status: DC

## 2014-10-17 NOTE — Progress Notes (Signed)
Subjective:    Patient ID: Danielle Carson, female    DOB: Dec 06, 1941, 73 y.o.   MRN: [id number]  Hypertension This is a chronic problem. The current episode started more than 1 year ago. The problem is unchanged. The problem is controlled. Pertinent negatives include no anxiety, blurred vision, chest pain, headaches, malaise/fatigue, neck pain, orthopnea, palpitations, peripheral edema, PND, shortness of breath or sweats. There are no associated agents to hypertension. Past treatments include angiotensin blockers. The current treatment provides significant improvement. There are no compliance problems.       Review of Systems  Constitutional: Negative.  Negative for fever, chills, malaise/fatigue, diaphoresis, appetite change and fatigue.  HENT: Negative.   Eyes: Negative.  Negative for blurred vision.  Respiratory: Negative.  Negative for cough, choking, chest tightness, shortness of breath and stridor.   Cardiovascular: Negative.  Negative for chest pain, palpitations, orthopnea, leg swelling and PND.  Gastrointestinal: Negative.  Negative for nausea, vomiting, abdominal pain, diarrhea, constipation and blood in stool.  Endocrine: Negative.   Genitourinary: Negative.   Musculoskeletal: Negative.  Negative for myalgias, back pain, joint swelling, arthralgias and neck pain.  Skin: Negative.  Negative for pallor and rash.  Allergic/Immunologic: Negative.   Neurological: Negative.  Negative for dizziness, tremors, seizures, facial asymmetry, speech difficulty, weakness, light-headedness and headaches.  Hematological: Negative.  Negative for adenopathy. Does not bruise/bleed easily.  Psychiatric/Behavioral: Negative.        Objective:   Physical Exam  Constitutional: She is oriented to person, place, and time. She appears well-developed and well-nourished.  Non-toxic appearance. She does not have a sickly appearance. She does not appear ill. No distress.  HENT:  Head:  Normocephalic and atraumatic.  Mouth/Throat: Oropharynx is clear and moist. No oropharyngeal exudate.  Eyes: Conjunctivae are normal. Right eye exhibits no discharge. Left eye exhibits no discharge. No scleral icterus.  Neck: Normal range of motion. Neck supple. No JVD present. No tracheal deviation present. No thyromegaly present.  Cardiovascular: Normal rate, regular rhythm, normal heart sounds and intact distal pulses.  Exam reveals no gallop and no friction rub.   No murmur heard. Pulmonary/Chest: Effort normal and breath sounds normal. No stridor. No respiratory distress. She has no wheezes. She has no rales. She exhibits no tenderness.  Abdominal: Soft. Bowel sounds are normal. She exhibits no distension and no mass. There is no tenderness. There is no rebound and no guarding.  Musculoskeletal: Normal range of motion. She exhibits no edema or tenderness.  Lymphadenopathy:    She has no cervical adenopathy.  Neurological: She is oriented to person, place, and time.  Skin: Skin is warm and dry. No rash noted. She is not diaphoretic. No erythema. No pallor.  Psychiatric: She has a normal mood and affect. Her behavior is normal. Judgment and thought content normal.  Vitals reviewed.    Lab Results  Component Value Date   WBC 5.6 06/20/2014   HGB 9.2* 06/20/2014   HCT 27.0* 06/20/2014   PLT 197 06/20/2014   GLUCOSE 93 10/01/2014   CHOL 139 05/14/2014   TRIG 19.0 05/14/2014   HDL 73.20 05/14/2014   LDLCALC 62 05/14/2014   ALT 18 10/01/2014   AST 22 10/01/2014   NA 135 10/01/2014   K 4.3 10/01/2014   CL 99 10/01/2014   CREATININE 1.16* 10/01/2014   BUN 17 10/01/2014   CO2 27 10/01/2014   TSH 2.600 10/01/2014   INR 1.06 06/11/2014   HGBA1C 6.0 06/08/2013       Assessment &  Plan:

## 2014-10-17 NOTE — Progress Notes (Signed)
Pre visit review using our clinic review tool, if applicable. No additional management support is needed unless otherwise documented below in the visit note. 

## 2014-10-17 NOTE — Assessment & Plan Note (Signed)
Her BP is well controlled 

## 2014-10-17 NOTE — Assessment & Plan Note (Signed)
Her CBC is normal now This has resolved

## 2014-10-17 NOTE — Patient Instructions (Signed)

## 2015-02-06 IMAGING — CR DG HIP 1V PORT*L*
1 series · 1 of 1 positions shown · non-contrast
Comparison: DG HIP PORTABLE 1 VIEW*L* dated 03/17/2012

CLINICAL DATA: Dislocation

EXAM:
PORTABLE LEFT HIP - 1 VIEW

[AP]
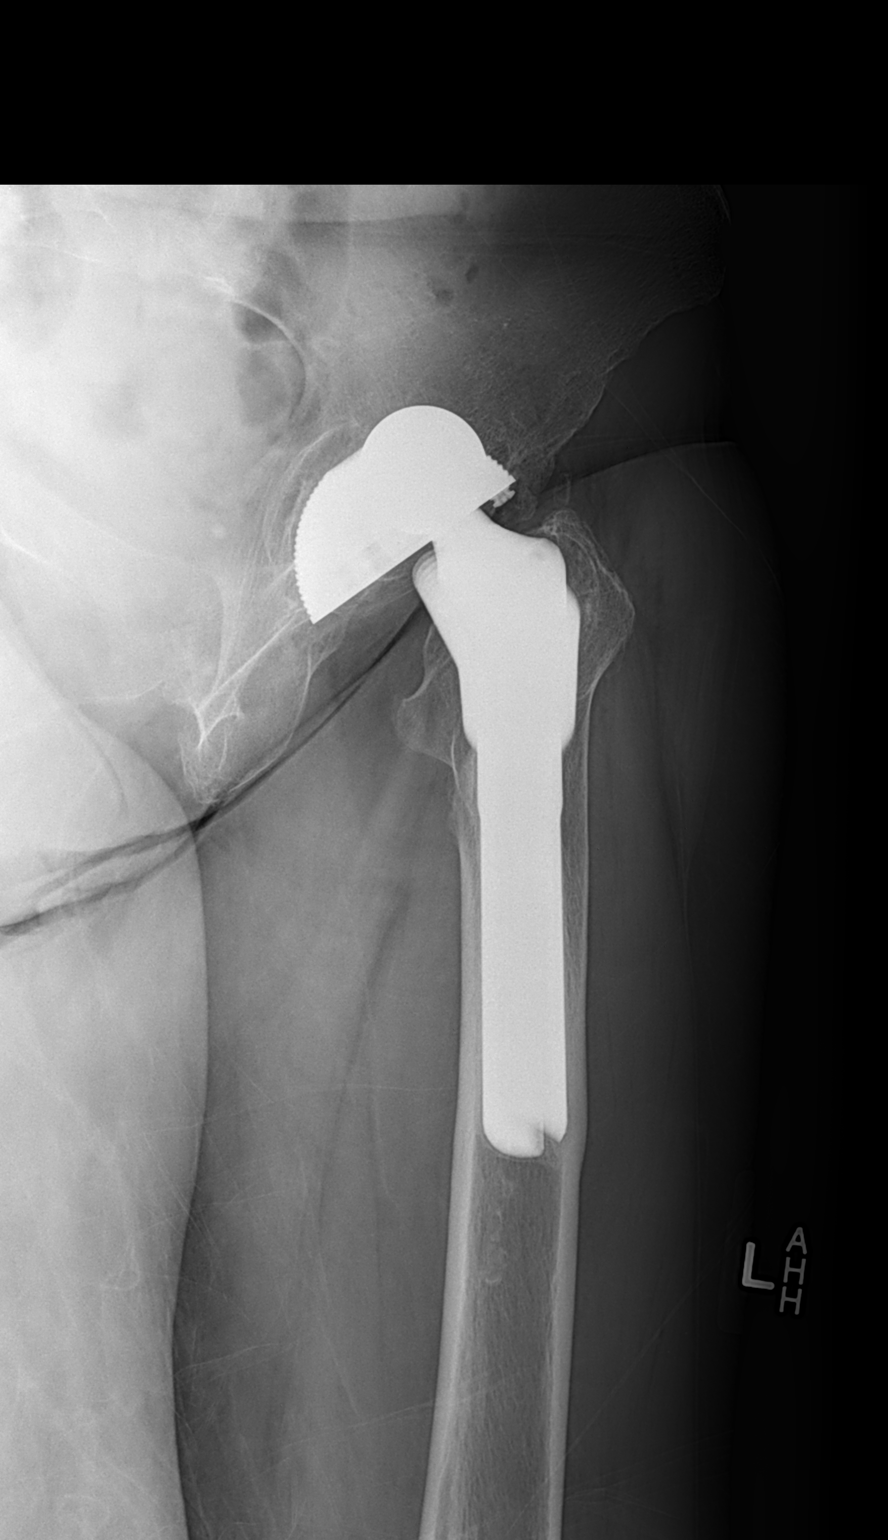

[1 of 1 positions shown; findings below may reference images not displayed]

FINDINGS: Left total hip arthroplasty is dislocated. No breakage or loosening
of the hardware. No fracture.
IMPRESSION: Dislocated left total hip arthroplasty.

## 2015-02-06 IMAGING — CR DG HIP 1V PORT*L*
1 series · 1 of 1 positions shown · non-contrast
Comparison: Earlier the same day

CLINICAL DATA: Postreduction of the left hip.

EXAM:
PORTABLE LEFT HIP - 1 VIEW

[AP]
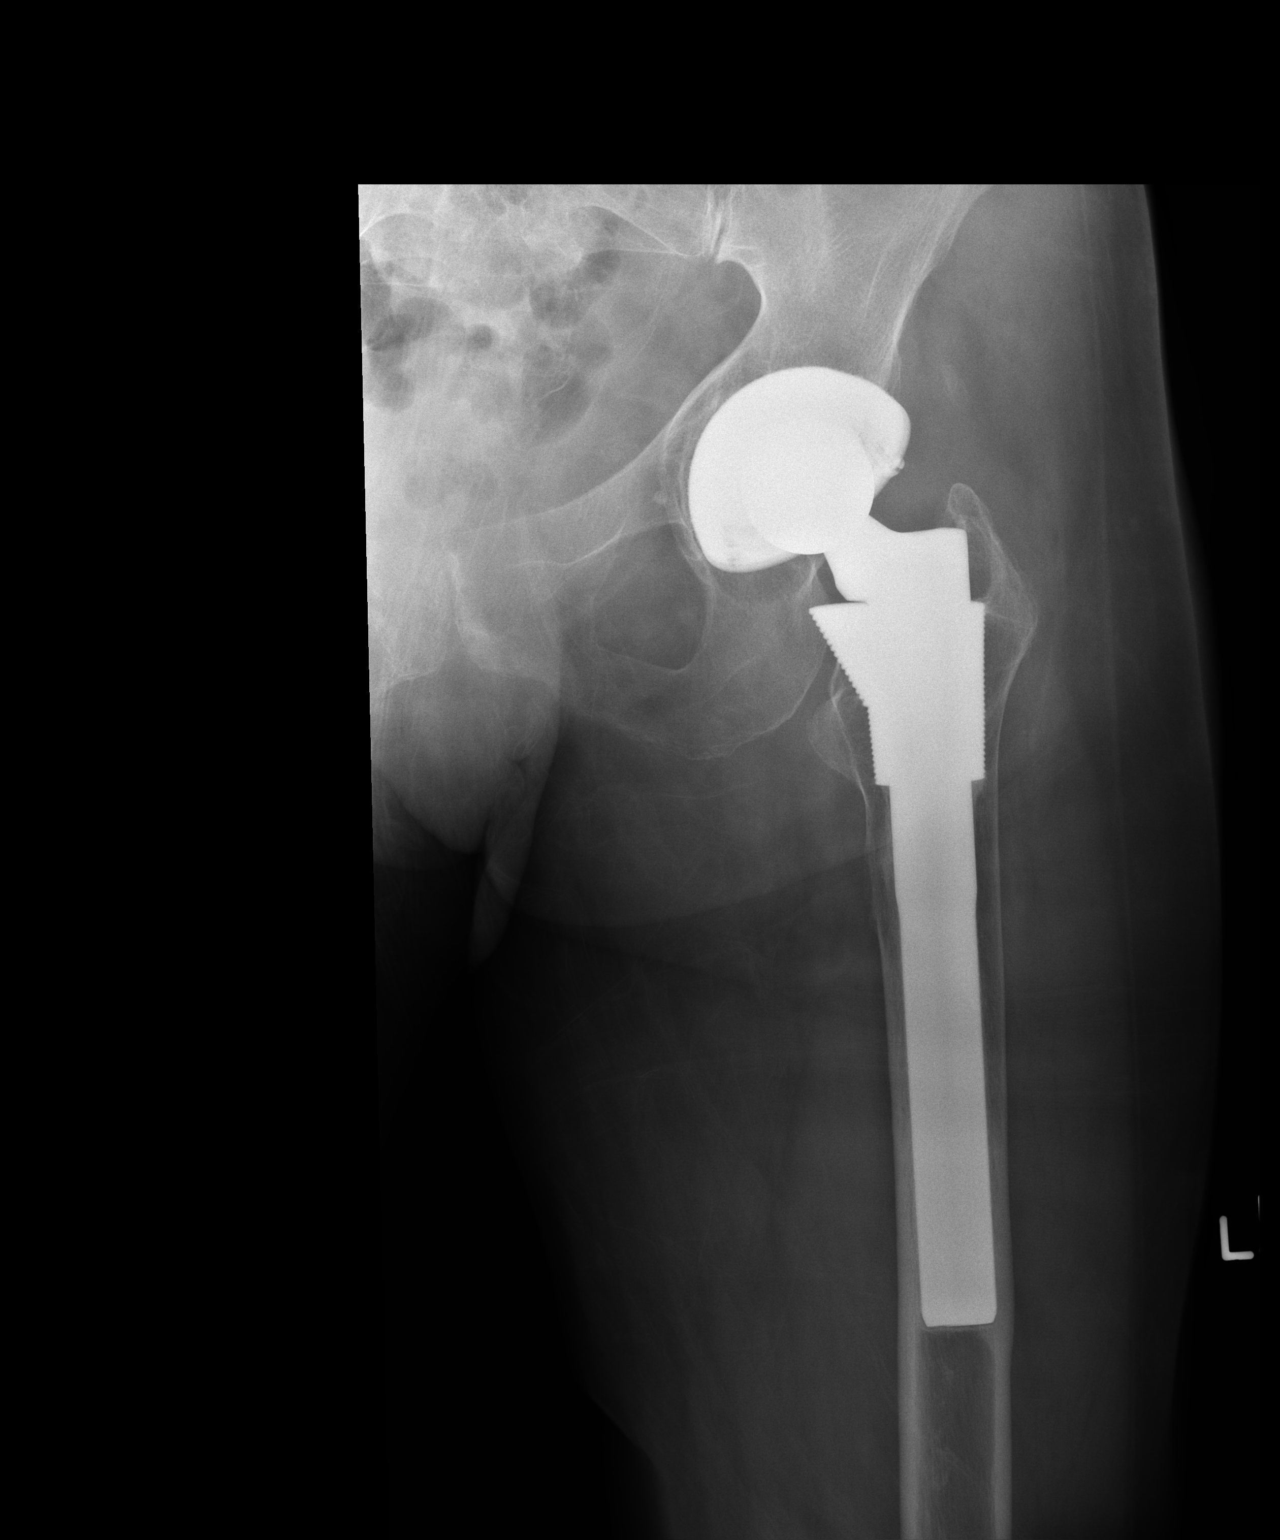

[1 of 1 positions shown; findings below may reference images not displayed]

FINDINGS: Single AP portable view of the left hip shows more appropriate
alignment of the femoral and acetabular components of the hip
replacement, suggesting interval reduction of the dislocation. No
evidence for periprosthetic fracture.
IMPRESSION: The femoral component now appears appropriately located relative to
the acetabular cup.

## 2015-04-15 ENCOUNTER — Other Ambulatory Visit: Payer: Self-pay | Admitting: *Deleted

## 2015-04-15 DIAGNOSIS — E211 Secondary hyperparathyroidism, not elsewhere classified: Secondary | ICD-10-CM

## 2015-04-15 LAB — COMPREHENSIVE METABOLIC PANEL
ALT: 16 U/L (ref 0–35)
AST: 22 U/L (ref 0–37)
Albumin: 4.1 g/dL (ref 3.5–5.2)
Alkaline Phosphatase: 56 U/L (ref 39–117)
BILIRUBIN TOTAL: 0.6 mg/dL (ref 0.2–1.2)
BUN: 19 mg/dL (ref 6–23)
CO2: 26 meq/L (ref 19–32)
CREATININE: 0.99 mg/dL (ref 0.50–1.10)
Calcium: 9.3 mg/dL (ref 8.4–10.5)
Chloride: 100 mEq/L (ref 96–112)
GLUCOSE: 89 mg/dL (ref 70–99)
Potassium: 4.1 mEq/L (ref 3.5–5.3)
SODIUM: 135 meq/L (ref 135–145)
Total Protein: 6.8 g/dL (ref 6.0–8.3)

## 2015-04-15 LAB — TSH: TSH: 2.052 u[IU]/mL (ref 0.350–4.500)

## 2015-04-15 LAB — CALCIUM: Calcium: 9.3 mg/dL (ref 8.4–10.5)

## 2015-04-15 LAB — T3, FREE: T3 FREE: 2.6 pg/mL (ref 2.3–4.2)

## 2015-04-15 LAB — T4, FREE: Free T4: 0.98 ng/dL (ref 0.80–1.80)

## 2015-04-16 LAB — VITAMIN D 25 HYDROXY (VIT D DEFICIENCY, FRACTURES): Vit D, 25-Hydroxy: 46 ng/mL (ref 30–100)

## 2015-04-16 LAB — PTH, INTACT AND CALCIUM
Calcium: 9.3 mg/dL (ref 8.4–10.5)
PTH: 29 pg/mL (ref 14–64)

## 2015-04-22 ENCOUNTER — Ambulatory Visit (INDEPENDENT_AMBULATORY_CARE_PROVIDER_SITE_OTHER): Payer: BC Managed Care – PPO | Admitting: "Endocrinology

## 2015-04-22 ENCOUNTER — Encounter: Payer: Self-pay | Admitting: "Endocrinology

## 2015-04-22 VITALS — BP 141/81 | HR 64 | Wt 153.0 lb

## 2015-04-22 DIAGNOSIS — R609 Edema, unspecified: Secondary | ICD-10-CM

## 2015-04-22 DIAGNOSIS — I10 Essential (primary) hypertension: Secondary | ICD-10-CM

## 2015-04-22 DIAGNOSIS — E063 Autoimmune thyroiditis: Secondary | ICD-10-CM

## 2015-04-22 DIAGNOSIS — E211 Secondary hyperparathyroidism, not elsewhere classified: Secondary | ICD-10-CM | POA: Diagnosis not present

## 2015-04-22 DIAGNOSIS — E049 Nontoxic goiter, unspecified: Secondary | ICD-10-CM | POA: Diagnosis not present

## 2015-04-22 DIAGNOSIS — M810 Age-related osteoporosis without current pathological fracture: Secondary | ICD-10-CM | POA: Diagnosis not present

## 2015-04-22 DIAGNOSIS — E559 Vitamin D deficiency, unspecified: Secondary | ICD-10-CM

## 2015-04-22 NOTE — Progress Notes (Signed)
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 She was unaccompanied for today's visit.   1. The patient was referred to me on 07/15/2005 for evaluation of osteoporosis by her former primary care provider, Dr. Sharlet SalinaMary John Baxley, M.D.   A. The patient developed bilateral hip joint pains in the late 1990s. In 1996, Dr. Gean BirchwoodFrank Rowan of [facility], ordered a bone mineral density study. That study showed significant osteoporosis in the spine and in both hips.  The patient was treated with calcium carbonate and with a multivitamin that included vitamin D. Despite taking these medications and having a reasonably robust exercise regimen, however, the patient's osteoporosis worsened. In 1999 Dr. Turner Danielsowan  performed bilateral hip replacements. Treatment with Fosamax was begun at about that time. In the intervening years bone mineral density had not changed significantly. She was treated with Boniva for several months, but just forgot to take it. Her past medical history revealed that she had had arthritis in many joints for many years. She had undergone natural menopause about 20 years prior. She did take estrogen for one or two years, then stopped that medication. Her surgeries included bilateral hip replacements and bilateral vein stripping. 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 Family history revealed that 5 of her 6 siblings had osteoporosis. Two of those had celiac disease when younger. A sister and brother had thyroid problems as well. [Addendum 04/04/13: At that time all 6 of her siblings were hypothyroid and took thyroid hormone. One brother became hypothyroid after radioactive iodine therapy. A niece has been diagnosed with Graves' disease.]  B. Her physical examination was  completely unremarkable. Laboratory data revealed that her PTH was 75.5, which was at the upper limit of normal. Her calcium was 8.7, which was in the lower 15% of the normal range. Her 25-hydroxy vitamin D was 39 which was normal. Her 1,25 vitamin D was 51 which was also normal. I felt that she had relative hyperparathyroidism due to relatively low calcium and vitamin D intake. I asked her to increase her calcium and vitamin D intake. On subsequent testing on 09/30/2005, her PTH was 37.0, calcium 9.3, 25-vitamin D 47, and 1,25-vitamin D 55.   2. During the past ten years the patient has done well overall. Because it was unclear how long she should remain on Fosamax treatment, I recommended that she obtain a second opinion from Dr. Gwendolyn Granthomas Weber at [facility]. She saw Dr. Valarie ConesWeber in June of 2009. He recommended that she not take either Fosamax or Boniva treatments. He felt that she had derived all of the benefit from Fosamax that she could. He recommended that she continue her calcium, vitamin D, and exercise. Since then she has had a left hip revision procedure performed.  She continued to have follow-up visits with Dr. Turner Danielsowan in orthopedics. A bone mineral density study performed on 02/16/2011 showed that her bone mineral density probably declined about 2.4-3.7% in the last 3 years. Since that study she has also had three hip dislocation procedures performed.  3.The patient's last [facility] visit was on 10/03/14. In the interim she has been healthy. Her left hip has not dislocated again since last July. She has not had any further diverticulitis.  She tolerates her losartan very well. She remains on calcium with vitamin D twice daily (600 mg  of Caltrate D twice daily). She has not had much leg edema recently. Otherwise she has been pretty healthy and has been doing well. She walks regularly without any limitation. She still has occasional calf cramps, but not very often. She has  been physically active. She was on a trip over the weekend and missed doses of losartan for several days.   4. Pertinent Review of Systems: Constitutional: The patient feels "good", is healthy, and has no significant complaints. She still has frequent trapezius and nuchal tension, especially when she spends a lot of time at her computer.  Eyes: Vision is good with her glasses. She just had an eye exam and has new glasses. There are no significant eye complaints. Neck: The patient has no other complaints of anterior neck swelling, soreness, tenderness,  pressure, discomfort, or difficulty swallowing.  Heart: Heart rate increases with exercise or other physical activity. The patient has no complaints of palpitations, irregular heat beats, chest pain, or chest pressure. Gastrointestinal: She still takes a probiotic to control her reflux. She notes occasional reflux if she eats late and/or eats too rapidly. Bowel movents seem normal. The patient has no complaints of excessive hunger, acid reflux, upset stomach, stomach aches or pains, diarrhea, or constipation. Legs: She still has some arthritis problems in the right knee. Muscle mass and strength seem normal. There are no complaints of numbness, tingling, or burning. She has occasional leg muscle pains after walking. She has not had much edema in the legs unless she is sitting for along time.   Feet: There are no obvious foot problems, except for arthritis in her toes. There are no complaints of numbness, tingling, burning, or pain. No edema is noted.  PAST MEDICAL, FAMILY, AND SOCIAL HISTORY: 1. Work and family: The patient will retire from [employer] in June. She and her husband will move to a new home at [facility], [place] in July. 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 2. Activities: She is active physically. She tries to walk about three times per week. She also uses a stair-stepper frequently.   3. Primary care provider:  She now sees Dr. Etta Grandchild at [place]. 4. Orthopedist: Dr. [name]  REVIEW OF SYSTEMS: There are no other significant problems involving her other body systems.  PHYSICAL EXAM: BP 141/81 mmHg  Pulse 64  Wt 153 lb (69.4 kg)  She missed her BP medication for the past two days. Constitutional: The patient looks healthy, physically and emotionally well, but somewhat slimmer over time.  Her weight is unchanged. She is alert, bright, and very mentally sharp.  Eyes: There is no arcus or proptosis.  Mouth: The oropharynx appears normal. The tongue appears normal. There is normal oral moisture. There is no obvious gingivitis. Neck: There are no bruits present. The thyroid gland appears normal in size. The thyroid gland is smaller and now within normal limits of 18-20 grams. Both lobes are normal in size. The consistency of the thyroid gland is normal today.  There is no thyroid tenderness to palpation today. Her trapezius muscles are tight and tender. Her nuchal cords are tight.  There are no supraclavicular masses or palpable lymph nodes.  Lungs: The lungs are clear. Air movement is good. Heart: The heart rhythm and rate appear normal. Heart sounds S1 and S2 are normal. I do not appreciate any pathologic heart murmurs. Abdomen: The abdomen is somewhat enlarged in size, but appropriate for age. Bowel sounds are normal.  The abdomen is soft and non-tender. There is no obviously palpable hepatomegaly, splenomegaly, or other masses.  Arms: Muscle mass appears appropriate for age.  Hands: There is no obvious tremor. Phalangeal and metacarpophalangeal joints appear normal. Palms are normal. Legs: Muscle mass appears appropriate for age. There is no edema bilaterally.  Neurologic: Muscle strength is normal for age and gender  in both the upper and the lower extremities. Muscle tone appears normal. Sensation to touch is normal in the legs.   Lab Data::   Labs 04/15/15: Calcium 9.3, PTH 29, 25-OH 46;  CMP normal; TSH 2.052, free T4 0.98, free T3 2.6  Labs 10/01/14: PTH 47, calcium 9.3, 25-hydroxy vitamin D 62, CMP with creatinine 1.16; TSH 2.60, free T4 1.14, 2.8  Labs 04/02/14: PTH 43.8, calcium 9.3, 25-hydroxy vitamin D 54, sodium 134; TSH 3.197, free T4 1.05, free T3 2.4  Labs 09/22/33: PTH 34, calcium 9.1, 25-hydroxy vitamin D 58, alkaline phosphatase 54, sodium 134, TSH 1.869, free T4 1.11, free T3 2.4  Labs 02/28/13: PTH 70.5, calcium 9.3, 25-hydroxy vitamin D 50, 1,25-dihydroxy vitamin D 64,   Labs 09/23/12: PTH 34.6, calcium 9.4, 25-hydroxy vitamin D 49, 1,25-dihydroxy vitamin D 53, TSH 1.767, free T4 1.21, free T3 2.6  Labs 03/24/12: PTH 15.3, calcium 9.8, 25-hydroxy vitamin D 56, 1.25-dihydroxy vitamin D 40, TSH 1.254, free T4 1.29, free T3 2.5.   03/29/13 Dexa scan: The patient's bone mineral density study performed showed a 2.6% increase of BMD in the spine, but a 9.4% decrease in the wrist.   ASSESSMENT: 1. Osteoporosis: The patient's osteoporosis was slightly better in the spine, but worse in the wrist in April 2014. Since her serum calcium is still below the 50%. I've asked her to increase her calcium and vitamin D to three times daily. She can also take Tums at bedtime if she needs to without worrying about developing hypercalcemia. Since she will be moving out of the area soon, we will repeat her BMD now.  2. Vitamin D deficiency: Vitamin D levels are currently quite good, but lower than at her last two visits. 3. Secondary hyperparathyroidism:  Her PTH is very good. I would like to keep her PTH in he lower half of the normal range, more around a level of 25-40.   4. Goiter/thyroiditis: Thyroid gland is smaller today and is not tender. The waxing and waning of thyroid gland size and the episodic tenderness over time are both c/w intermittent flare ups of Hashimoto's Dz. It is likely that she will eventually need to be treated with thyroid hormone,similar to her siblings. 5.  Hypocalcemia: Her serum calcium has increased slightly, but is still below the 50%. I would still like to see it higher over time to provide her adequate amounts of calcium to foster better calcium uptake by bone. 6. Hypertension: Her BP is elevated after missing her losartan for 2-3 days.   7. Pedal edema: She has no edema today.  8. Toe pain: resolved 9. 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 Massage therapy helps. Cervical stretching exercises also help.  PLAN: 1. Diagnostic: Will repeat her PTH, calcium, 25-vitamin D, TFTs and CMP at next visit. Order BMD. 2. Therapeutic: Continue losartan at 50 mg tablet each AM. Increase calcium-D to three times daily. Continue to exercise. Watch salt intake. 3. Patient education: We discussed the current recommendations from bone mineral density specialists as to calcium and vitamin D intake. I've asked her to try to increase the amount of  calcium which she obtains from natural sources, such as milk, but also to take high quality calcium supplements such as Tums, Oscal, and Citracal. We also discussed the issues of hypertension, edema, goiter, and thyroiditis at length.  4. Follow-up: The patient will have a follow-up appointment in 6 months. She says that it is worth it to her to come back to [facility] to see me every 6 months.   Level of Service: This visit lasted in excess of 50 minutes. More than 50% of the visit was devoted to counseling.  David Stall

## 2015-04-22 NOTE — Patient Instructions (Signed)
Follow up visit in 6 months. Please obtains lab studies at a [facility] lab if available one week prior to next visit. If not, then we will do them after the visit.

## 2015-05-01 ENCOUNTER — Telehealth: Payer: Self-pay | Admitting: *Deleted

## 2015-05-01 NOTE — Telephone Encounter (Signed)
LVM to advise scheduled Bone Density at [facility] for June 1st at [facility]. Advised not to take calcium or vitamins the day before and the day of the bone density. LI

## 2015-05-13 LAB — HM DEXA SCAN: HM Dexa Scan: -4.6

## 2015-07-01 IMAGING — CR DG PORTABLE PELVIS
2 series · 2 of 2 positions shown · non-contrast
Comparison: 01/24/2014

CLINICAL DATA: Status post left hip replacement

EXAM:
PORTABLE PELVIS 1-2 VIEWS

[AP (1 of 2)]
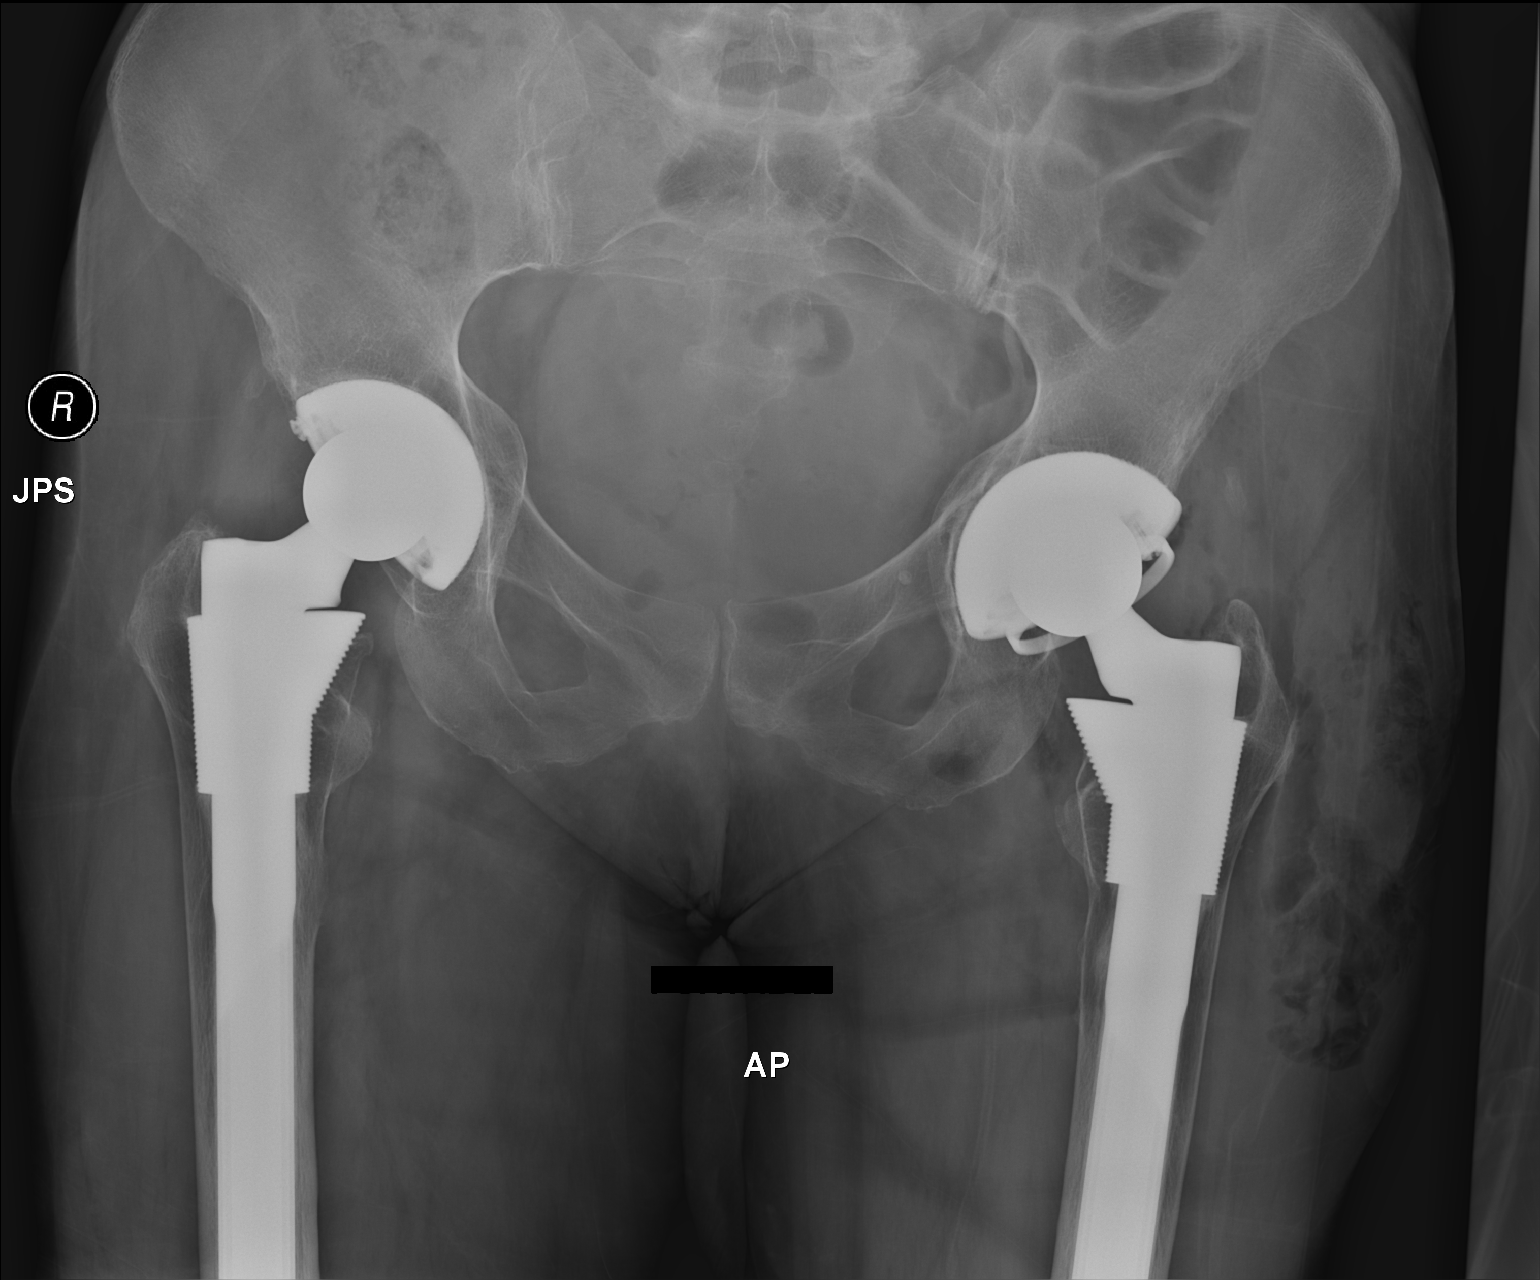

[AP (2 of 2)]
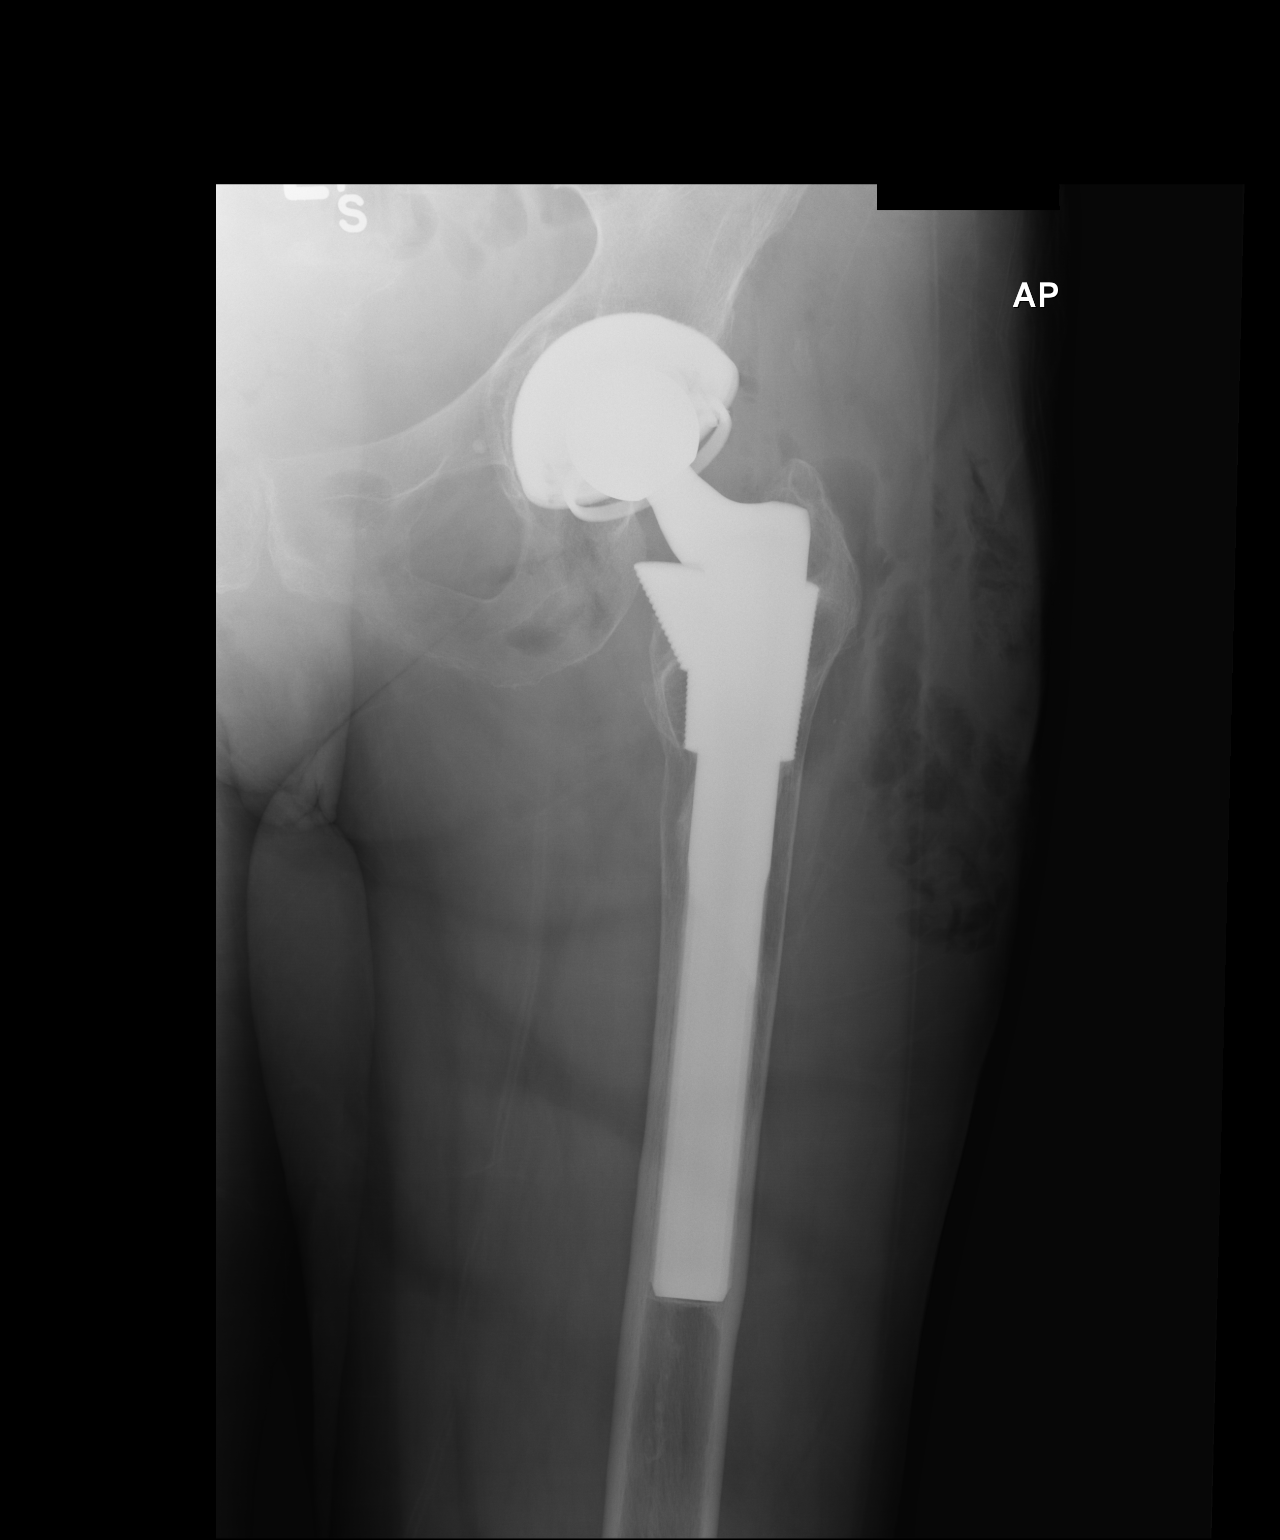

[2 of 2 positions shown; findings below may reference images not displayed]

FINDINGS: Revision of left hip prosthesis is noted. No acute abnormality is
seen. No acute fractures noted. Air is noted within the surgical
bed.
IMPRESSION: No acute abnormality following hip prosthesis revision.

## 2015-10-23 ENCOUNTER — Ambulatory Visit: Payer: BC Managed Care – PPO | Admitting: "Endocrinology

## 2015-11-12 ENCOUNTER — Other Ambulatory Visit: Payer: Self-pay | Admitting: *Deleted

## 2015-11-12 DIAGNOSIS — M810 Age-related osteoporosis without current pathological fracture: Secondary | ICD-10-CM

## 2015-11-12 DIAGNOSIS — E559 Vitamin D deficiency, unspecified: Secondary | ICD-10-CM

## 2015-11-16 LAB — COMPREHENSIVE METABOLIC PANEL
ALK PHOS: 54 U/L (ref 33–130)
ALT: 16 U/L (ref 6–29)
AST: 22 U/L (ref 10–35)
Albumin: 3.9 g/dL (ref 3.6–5.1)
BUN: 14 mg/dL (ref 7–25)
CALCIUM: 8.8 mg/dL (ref 8.6–10.4)
CO2: 33 mmol/L — ABNORMAL HIGH (ref 20–31)
Chloride: 94 mmol/L — ABNORMAL LOW (ref 98–110)
Creat: 0.87 mg/dL (ref 0.60–0.93)
Glucose, Bld: 140 mg/dL — ABNORMAL HIGH (ref 70–99)
POTASSIUM: 4.2 mmol/L (ref 3.5–5.3)
Sodium: 130 mmol/L — ABNORMAL LOW (ref 135–146)
TOTAL PROTEIN: 6.3 g/dL (ref 6.1–8.1)
Total Bilirubin: 0.3 mg/dL (ref 0.2–1.2)

## 2015-11-16 LAB — T4, FREE: FREE T4: 1.4 ng/dL (ref 0.80–1.80)

## 2015-11-16 LAB — T3, FREE: T3 FREE: 2.4 pg/mL (ref 2.3–4.2)

## 2015-11-16 LAB — TSH: TSH: 2.629 u[IU]/mL (ref 0.350–4.500)

## 2015-11-18 LAB — VITAMIN D 1,25 DIHYDROXY
VITAMIN D3 1, 25 (OH): 39 pg/mL
Vitamin D 1, 25 (OH)2 Total: 39 pg/mL (ref 18–72)
Vitamin D2 1, 25 (OH)2: <8 pg/mL

## 2015-11-18 LAB — PTH, INTACT AND CALCIUM
Calcium: 8.8 mg/dL (ref 8.4–10.5)
PTH: 36 pg/mL (ref 14–64)

## 2015-11-25 ENCOUNTER — Ambulatory Visit (INDEPENDENT_AMBULATORY_CARE_PROVIDER_SITE_OTHER): Payer: Medicare Other | Admitting: "Endocrinology

## 2015-11-25 ENCOUNTER — Encounter: Payer: Self-pay | Admitting: "Endocrinology

## 2015-11-25 VITALS — BP 149/74 | HR 62 | Wt 155.6 lb

## 2015-11-25 DIAGNOSIS — E871 Hypo-osmolality and hyponatremia: Secondary | ICD-10-CM | POA: Diagnosis not present

## 2015-11-25 DIAGNOSIS — E063 Autoimmune thyroiditis: Secondary | ICD-10-CM

## 2015-11-25 DIAGNOSIS — M810 Age-related osteoporosis without current pathological fracture: Secondary | ICD-10-CM

## 2015-11-25 DIAGNOSIS — E559 Vitamin D deficiency, unspecified: Secondary | ICD-10-CM

## 2015-11-25 DIAGNOSIS — E878 Other disorders of electrolyte and fluid balance, not elsewhere classified: Secondary | ICD-10-CM | POA: Diagnosis not present

## 2015-11-25 DIAGNOSIS — I10 Essential (primary) hypertension: Secondary | ICD-10-CM | POA: Diagnosis not present

## 2015-11-25 DIAGNOSIS — E049 Nontoxic goiter, unspecified: Secondary | ICD-10-CM

## 2015-11-25 DIAGNOSIS — R609 Edema, unspecified: Secondary | ICD-10-CM

## 2015-11-25 DIAGNOSIS — E211 Secondary hyperparathyroidism, not elsewhere classified: Secondary | ICD-10-CM

## 2015-11-25 NOTE — Progress Notes (Signed)
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 She was unaccompanied for today's visit.   1. The patient was referred to me on 07/15/2005 for evaluation of osteoporosis by her former primary care provider, Dr. [name], M.D.   A. The patient developed bilateral hip joint pains in the late 1990s. In 1996, Dr. [name] of [facility] ordered a bone mineral density study. That study showed significant osteoporosis in the spine and in both hips.  The patient was treated with calcium carbonate and with a multivitamin that included vitamin D. Despite taking these medications and having a reasonably robust exercise regimen, however, the patient's osteoporosis worsened. In 1999 Dr. [name]  performed bilateral hip replacements. Treatment with Fosamax was begun at about that time. In the intervening years bone mineral density had not changed significantly. She was treated with Boniva for several months, but just forgot to take it. Her past medical history revealed that she had had arthritis in many joints for many years. She had undergone natural menopause about 20 years prior. She did take estrogen for one or two years, then stopped that medication. Her surgeries included bilateral hip replacements and bilateral vein stripping. 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 Family history revealed that 5 of her 6 siblings had osteoporosis. Two of those had celiac disease when younger. A sister and brother had thyroid problems as well. [Addendum 04/04/13: In early 2014 all 6 of her siblings were hypothyroid and took thyroid hormone. One brother became hypothyroid after radioactive iodine therapy. A niece has been diagnosed with Graves' disease.]  B. Her physical examination was  completely unremarkable. Laboratory data revealed that her PTH was 75.5, which was at the upper limit of normal. Her calcium was 8.7, which was in the lower 15% of the normal range. Her 25-hydroxy vitamin D was 39 which was normal. Her 1,25 vitamin D was 51 which was also normal. I felt that she had relative hyperparathyroidism due to relatively low calcium and vitamin D intake. I asked her to increase her calcium and vitamin D intake. On subsequent testing on 09/30/2005, her PTH was 37.0, calcium 9.3, 25-vitamin D 47, and 1,25-vitamin D 55.   2. During the past ten years the patient has done well overall. Because it was unclear how long she should remain on Fosamax treatment, I recommended that she obtain a second opinion from Dr. Gwendolyn Grant at [facility]. She saw Dr. Valarie Cones in June of 2009. He recommended that she not take either Fosamax or Boniva treatments. He felt that she had derived all of the benefit from Fosamax that she could. He recommended that she continue her calcium, vitamin D, and exercise. Since then she has had a left hip revision procedure performed.  She continued to have follow-up visits with Dr. [name] in orthopedics. A bone mineral density study performed on 02/16/2011 showed that her bone mineral density probably declined about 2.4-3.7% in the previous 3 years. Since that study she has also had three hip dislocation procedures performed. She has been traveling a lot.   3.The patient's last [facility] visit was on 04/22/15. In the interim she had a case of diverticulitis three weeks ago. She has developed a severe sun allergy. Her left hip has not dislocated again.  Her BP was lower several times, so her  new PCP reduced her losartan by 50%. She remains on calcium with vitamin D twice daily (600 mg of Caltrate D twice daily), but has missed many doses while traveling. She has not had much leg edema recently. She has some right knee pains, but walks  regularly without any limitation. She has purposely been drinking a lot more water in an attempt to prevent further attacks of diverticulitis. She restricts her salt intake.  4. Pertinent Review of Systems: Constitutional: The patient feels "good", is healthy, and has no significant complaints. She has much less trapezius and nuchal tension.   Eyes: Vision is good with her glasses. She had an eye exam about one year ago. There are no significant eye complaints. Neck: The patient has no other complaints of anterior neck swelling, soreness, tenderness,  pressure, discomfort, or difficulty swallowing.  Heart: Heart rate increases with exercise or other physical activity. The patient has no complaints of palpitations, irregular heat beats, chest pain, or chest pressure. Gastrointestinal: Bowel movements are variable based upon her travel schedule. She still takes a probiotic to control her reflux. She is not having much reflux. The patient has no complaints of excessive hunger, upset stomach, stomach aches or pains, diarrhea, or constipation. She has a small left inguinal hernia which does nor require surgery at this time. Legs: She still has some arthritis problems in the right knee. Muscle mass and strength seem normal. There are no complaints of numbness, tingling, or burning. She has occasional leg muscle pains after walking. She has not had much edema in the legs unless she is sitting for along time.   Feet: There are no obvious foot problems, except for arthritis in her toes. There are no complaints of numbness, tingling, burning, or pain. No edema is noted.  PAST MEDICAL, FAMILY, AND SOCIAL HISTORY: 1. Work and family: The patient retired from [employer] in June and she and her husband moved to their new home near [place], [place] in July. There is not a local endocrinologist near her, but there are endos in [place], about 30 minutes away. I offered to refer her to some endocrinologists in [place],  but she wants to continue to see me. 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 2. Activities: She is active physically. She tries to walk about twice a week and she does zumba three times per week.  3. Primary care provider: She now sees Dr. Costella Hatcher in [facility] [phone]. 4. Orthopedist: Dr. [name]  REVIEW OF SYSTEMS: There are no other significant problems involving her other body systems.  PHYSICAL EXAM: BP 149/74 mmHg  Pulse 62  Wt 155 lb 9.6 oz (70.58 kg)  She missed her BP medication for the past two days. Constitutional: The patient looks healthy, physically and emotionally well.  Her weight is 2.5 pounds higher. She is alert, bright, and very mentally sharp.  Eyes: There is no arcus or proptosis.  Mouth: The oropharynx appears normal. The tongue appears normal. There is normal oral moisture. There is no obvious gingivitis. Neck: There are no bruits present. The thyroid gland appears normal in size. The thyroid gland is again small and within normal limits of 18-20 grams. Both lobes are normal in size. The consistency of the thyroid gland is normal today.  There is no thyroid tenderness to palpation today. Her trapezius muscles are still somewhat tight and tender, but much less so. Her nuchal cords are not tight.  There are no  supraclavicular masses or palpable lymph nodes.  Lungs: The lungs are clear. Air movement is good. Heart: The heart rhythm and rate appear normal. Heart sounds S1 and S2 are normal. I do not appreciate any pathologic heart murmurs. Abdomen: The abdomen is somewhat enlarged in size, but appropriate for age.  Bowel sounds are normal. The abdomen is soft and non-tender. There is no obviously palpable hepatomegaly, splenomegaly, or other masses.  Arms: Muscle mass appears appropriate for age.  Hands: There is no obvious tremor. Phalangeal and metacarpophalangeal joints appear normal. Palms are normal. Legs: Muscle  mass appears appropriate for age. There is no edema bilaterally.  Neurologic: Muscle strength is normal for age and gender  in both the upper and the lower extremities. Muscle tone appears normal. Sensation to touch is normal in the legs.   Lab Data:  Labs 11/15/15: PTH 36, calcium 8.8, 1,25-OH vitamin D 39; CMP normal, except for sodium 130, chloride 94, CO2 33, glucose 140; TSH 2.629, free T4 1.40, free T3 2.4  Labs 04/15/15: Calcium 9.3, PTH 29, 25-OH 46; CMP normal; TSH 2.052, free T4 0.98, free T3 2.6  Labs 10/01/14: PTH 47, calcium 9.3, 25-hydroxy vitamin D 62, CMP with creatinine 1.16; TSH 2.60, free T4 1.14, 2.8  Labs 04/02/14: PTH 43.8, calcium 9.3, 25-hydroxy vitamin D 54, sodium 134; TSH 3.197, free T4 1.05, free T3 2.4  Labs 09/22/33: PTH 34, calcium 9.1, 25-hydroxy vitamin D 58, alkaline phosphatase 54, sodium 134, TSH 1.869, free T4 1.11, free T3 2.4  Labs 02/28/13: PTH 70.5, calcium 9.3, 25-hydroxy vitamin D 50, 1,25-dihydroxy vitamin D 64,   Labs 09/23/12: PTH 34.6, calcium 9.4, 25-hydroxy vitamin D 49, 1,25-dihydroxy vitamin D 53, TSH 1.767, free T4 1.21, free T3 2.6  Labs 03/24/12: PTH 15.3, calcium 9.8, 25-hydroxy vitamin D 56, 1.25-dihydroxy vitamin D 40, TSH 1.254, free T4 1.29, free T3 2.5.   03/29/13 Dexa scan: The patient's bone mineral density study performed showed a 2.6% increase of BMD in the spine, but a 9.4% decrease in the wrist.   ASSESSMENT: 1. Osteoporosis: The patient's osteoporosis was slightly better in the spine, but worse in the wrist in April 2014. Since her serum calcium is still below the 50%. I asked her to increase her calcium and vitamin D to three times daily at her last visit, but she has really missed many doses. She can also take Tums at bedtime if she needs to without worrying about developing hypercalcemia. 2. Vitamin D deficiency: Her 1,25-dihydroxy vitamin D level is normal, but much lower. I suspect that she needs more vitamin D intake as  well.  3. Secondary hyperparathyroidism:  Her PTH is good. I would like to keep her PTH in the lower half of the normal range, around a level of 25-40.   4. Goiter/thyroiditis: Thyroid gland is small again today and is not tender. The waxing and waning of thyroid gland size and the episodic tenderness over time are both c/w intermittent flare ups of Hashimoto's Dz. It is likely that she will eventually need to be treated with thyroid hormone, similar to her siblings. 5. Hypocalcemia: Her serum calcium has remained at the lower end of the normal range. She needs more calcium and more vitamin D. I would still like to see it higher over time to provide her adequate amounts of calcium to foster better calcium uptake by bone. 6. Hypertension: Her BP is elevated after being on reduced doses of losartan. However, her BPs at home when she is  not traveling have been lower and we know that she has not taken her losartan for several days. I asked her to take the losartan daily.  7. Pedal edema: She has no edema today.  8. Toe pain: Essentially resolved 9. 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 Massage therapy helps. Cervical stretching exercises also help. 10-11: Hyponatremia and hypochloremia: It appears that she has been maintaining a low salt diet, but also been taking in much more free water. I cautioned her to not to take in excessive amounts of water.    PLAN: 1. Diagnostic: Will repeat her CMP next month. Repeat PTH, calcium, 25-vitamin D, TFTs and CMP at next visit. Order BMD again. 2. Therapeutic: Continue losartan at 25 mg (1/2 tablet) each AM. Increase calcium-D to three times daily. Continue to exercise. Watch fluid intake. 3. Patient education: We discussed the current recommendations from bone mineral density specialists as to calcium and vitamin D intake. I've asked her to try to increase the amount of calcium which she obtains from natural sources,  such as milk, but also to take high quality calcium supplements such as Tums, Oscal, and Citracal. We also discussed the issues of hypertension, edema, goiter, and thyroiditis at length.  4. Follow-up: The patient will have a follow-up appointment in 6 months. She says that it is worth it to her to come back to [facility] to see me every 6 months.   Level of Service: This visit lasted in excess of 50 minutes. More than 50% of the visit was devoted to counseling.  David Stall

## 2015-11-25 NOTE — Patient Instructions (Signed)
Follow up visit in 6 months. Please obtain repeat lab test in January. [facility].

## 2015-11-27 DIAGNOSIS — E871 Hypo-osmolality and hyponatremia: Secondary | ICD-10-CM | POA: Insufficient documentation

## 2016-05-25 ENCOUNTER — Encounter: Payer: Self-pay | Admitting: "Endocrinology

## 2016-05-25 ENCOUNTER — Ambulatory Visit (INDEPENDENT_AMBULATORY_CARE_PROVIDER_SITE_OTHER): Payer: Medicare Other | Admitting: "Endocrinology

## 2016-05-25 VITALS — BP 134/66 | HR 57 | Wt 148.8 lb

## 2016-05-25 DIAGNOSIS — I10 Essential (primary) hypertension: Secondary | ICD-10-CM

## 2016-05-25 DIAGNOSIS — R001 Bradycardia, unspecified: Secondary | ICD-10-CM

## 2016-05-25 DIAGNOSIS — E559 Vitamin D deficiency, unspecified: Secondary | ICD-10-CM

## 2016-05-25 DIAGNOSIS — E211 Secondary hyperparathyroidism, not elsewhere classified: Secondary | ICD-10-CM | POA: Diagnosis not present

## 2016-05-25 DIAGNOSIS — R600 Localized edema: Secondary | ICD-10-CM

## 2016-05-25 DIAGNOSIS — M810 Age-related osteoporosis without current pathological fracture: Secondary | ICD-10-CM

## 2016-05-25 DIAGNOSIS — E049 Nontoxic goiter, unspecified: Secondary | ICD-10-CM

## 2016-05-25 DIAGNOSIS — E878 Other disorders of electrolyte and fluid balance, not elsewhere classified: Secondary | ICD-10-CM | POA: Insufficient documentation

## 2016-05-25 DIAGNOSIS — E063 Autoimmune thyroiditis: Secondary | ICD-10-CM

## 2016-05-25 NOTE — Patient Instructions (Signed)
Follow up visit in 6 months. 

## 2016-05-25 NOTE — Progress Notes (Signed)
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 She was unaccompanied for today's visit.   1. The patient was referred to me on 07/15/2005 for evaluation of osteoporosis by her former primary care provider, Dr. [name], M.D.   A. The patient developed bilateral hip joint pains in the late 1990s. In 1996, Dr. [name] of [facility] ordered a bone mineral density study. That study showed significant osteoporosis in the spine and in both hips.  The patient was treated with calcium carbonate and with a multivitamin that included vitamin D. Despite taking these medications and having a reasonably robust exercise regimen, however, the patient's osteoporosis worsened. In 1999 Dr. [name]  performed bilateral hip replacements. Treatment with Fosamax was begun at about that time. In the intervening years bone mineral density had not changed significantly. She was treated with Boniva for several months, but just forgot to take it. Her past medical history revealed that she had had arthritis in many joints for many years. She had undergone natural menopause about 20 years prior. She did take estrogen for one or two years, then stopped that medication. Her surgeries included bilateral hip replacements and bilateral vein stripping. 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 Family history revealed that 5 of her 6 siblings had osteoporosis. Two of those had celiac disease when younger. A sister and brother had thyroid problems as well. [Addendum 04/04/13: In early 2014 all 6 of her siblings were hypothyroid and took thyroid hormone. One brother became hypothyroid after radioactive iodine therapy. A niece has been diagnosed with Graves' disease.]  B. Her physical examination was  completely unremarkable. Laboratory data revealed that her PTH was 75.5, which was at the upper limit of normal. Her calcium was 8.7, which was in the lower 15% of the normal range. Her 25-hydroxy vitamin D was 39 which was normal. Her 1,25 vitamin D was 51 which was also normal. I felt that she had relative hyperparathyroidism due to relatively low calcium and vitamin D intake. I asked her to increase her calcium and vitamin D intake. On subsequent testing on 09/30/2005, her PTH was 37.0, calcium 9.3, 25-vitamin D 47, and 1,25-vitamin D 55.   2. During the past eleven years the patient has done well overall. Because it was unclear how long she should remain on Fosamax treatment, I recommended that she obtain a second opinion from Dr. Gwendolyn Grant at [facility]. She saw Dr. Valarie Cones in June of 2009. He recommended that she not take either Fosamax or Boniva treatments any longer. He felt that she had derived all of the benefit from Fosamax that she could. He recommended that she continue her calcium, vitamin D, and exercise. Since then she has had a left hip revision procedure performed.  She continued to have follow-up visits with Dr. [name] in orthopedics. A bone mineral density study performed on 02/16/2011 showed that her bone mineral density probably declined about 2.4-3.7% in the previous 3 years. Since that study she has also had three hip dislocation procedures performed.   3.The patient's last [facility] visit was on 11/25/15. In the interim "It's been an interesting 6 months.   A. She had another attack of diverticulitis 3 moths ago. She then went to see a GI specialist in [place], Dr. Novella Rob, MD.  She had  a celiac panel, EGD, and colonoscopy. Mild antral gastritis and celiac disease were diagnosed. She is now on a gluten-free diet.   B. She still has a severe sun allergy, so she avoids sun exposure.  C. She stopped losartan for several months, but has  resumed taking 25 mg/day (one-half of a 50 mg tablet). She also takes calcium with vitamin D, 600 mg of Caltrate D, twice daly and atorvastatin, 10 mg/day.  C.She does not have much leg edema.    D. She still walks about 2 miles per day. Unfortunately, she has severe arthritis in her right knee, which does not bother her on flat ground, but does bother her on stairs or on hills.    4. Pertinent Review of Systems: Constitutional: The patient feels "pretty good", is healthy, and has no other significant complaints. She no longer has much trapezius and nuchal tension since retiring and moving to [place], [place].    Eyes: Vision is good with her glasses. She had an eye exam about two years ago. There are no significant eye complaints. Neck: The patient has no other complaints of anterior neck swelling, soreness, tenderness,  pressure, discomfort, or difficulty swallowing.  Heart: Heart rate increases with exercise or other physical activity. The patient has no complaints of palpitations, irregular heat beats, chest pain, or chest pressure. Gastrointestinal: Bowel movements are variable based upon her travel schedule. She still takes a probiotic to control her reflux. She is not having much reflux. The patient has no complaints of excessive hunger, upset stomach, stomach aches or pains, diarrhea, or constipation. She has a small left inguinal hernia which does not require surgery at this time. Legs: As above. She still has some arthritis problems in the right knee. Muscle mass and strength seem normal. There are no complaints of numbness, tingling, or burning. She has occasional leg muscle pains after walking. She has not had much edema in the legs unless she is sitting for along time.   Feet: There are no obvious foot problems, except for arthritis in her toes. There are no complaints of numbness, tingling, burning, or pain. No edema is noted.  PAST MEDICAL, FAMILY, AND SOCIAL HISTORY: 1. Work and family:  The patient retired from [employer] in June 2016 and she and her husband moved to their new home near [place], [place] in July. There is not a local endocrinologist near her, but there are endos in [place], about 30 minutes away. I offered to refer her to some endocrinologists in [place], but she wants to continue to see me. 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 2. Activities: She is active physically. She tries to walk daily.   3. Primary care provider: She now sees Dr. Costella Hatcher in [facility] [phone]. 4. Orthopedist: Dr. [name] 5. GI: Dr. Novella Rob  REVIEW OF SYSTEMS: There are no other significant problems involving her other body systems.  PHYSICAL EXAM: BP 134/66 mmHg  Pulse 57  Wt 148 lb 12.8 oz (67.495 kg)   Constitutional: The patient looks healthy, physically and emotionally well.  Her weight is 2.5 pounds higher. She is alert, bright, and very mentally sharp.  Eyes: There is no arcus or proptosis.  Mouth: The oropharynx appears normal. The tongue appears normal. There is normal oral moisture. There is no obvious gingivitis. Neck: There are no bruits present. The thyroid gland appears normal in size. The thyroid gland is slightly larger at about 21  grams in size,. The right lobe is again within normal limits. The left lobe is mildly enlarged. The consistency of the thyroid gland is fairly normal today, with some areas firmer than others. There is some discomfort in her left superior pole to palpation today. Her trapezius muscles are still somewhat tight, but are not tender today. There are no supraclavicular masses. She has 1 cm left and right anterior superior cervical nodes.  Lungs: The lungs are clear. Air movement is good. Heart: The heart rhythm and rate appear normal. Heart sounds S1 and S2 are normal. I do not appreciate any pathologic heart murmurs. Abdomen: The abdomen is still somewhat enlarged in size, but  appropriate for age.  Bowel sounds are normal. The abdomen is soft and non-tender. There is no obviously palpable hepatomegaly, splenomegaly, or other masses.  Arms: Muscle mass appears appropriate for age.  Hands: There is no obvious tremor. Phalangeal and metacarpophalangeal joints appear normal. Palms are normal. Legs: Muscle mass appears appropriate for age. There is no edema bilaterally. There is no shin tenderness bilaterally. Neurologic: Muscle strength is normal for age and gender  in both the upper and the lower extremities. Muscle tone appears normal. Sensation to touch is normal in the legs.   Lab Data:  Labs about 02/14/16: CBC normal, CMP normal, cholesterol 147, triglycerides 40, HDL 74, LDL 65; vitamin B12 1565 (ref 211-946), folate >20 (ref >3); TSH 3.45, free T4 1.23, total T3 77 (ref 71-180); calcium 9.3, 25-OH vitamin D 43.8  Labs 11/15/15: PTH 36, calcium 8.8, 1,25-OH vitamin D 39; CMP normal, except for sodium 130, chloride 94, CO2 33, glucose 140; TSH 2.629, free T4 1.40, free T3 2.4  Labs 04/15/15: Calcium 9.3, PTH 29, 25-OH 46; CMP normal; TSH 2.052, free T4 0.98, free T3 2.6  Labs 10/01/14: PTH 47, calcium 9.3, 25-hydroxy vitamin D 62, CMP with creatinine 1.16; TSH 2.60, free T4 1.14, 2.8  Labs 04/02/14: PTH 43.8, calcium 9.3, 25-hydroxy vitamin D 54, sodium 134; TSH 3.197, free T4 1.05, free T3 2.4  Labs 09/22/33: PTH 34, calcium 9.1, 25-hydroxy vitamin D 58, alkaline phosphatase 54, sodium 134, TSH 1.869, free T4 1.11, free T3 2.4  Labs 02/28/13: PTH 70.5, calcium 9.3, 25-hydroxy vitamin D 50, 1,25-dihydroxy vitamin D 64,   Labs 09/23/12: PTH 34.6, calcium 9.4, 25-hydroxy vitamin D 49, 1,25-dihydroxy vitamin D 53, TSH 1.767, free T4 1.21, free T3 2.6  Labs 03/24/12: PTH 15.3, calcium 9.8, 25-hydroxy vitamin D 56, 1.25-dihydroxy vitamin D 40, TSH 1.254, free T4 1.29, free T3 2.5.   03/29/13 Dexa scan: The patient's bone mineral density study performed showed a 2.6%  increase of BMD in the spine, but a 9.4% decrease in the wrist.   ASSESSMENT: 1. Osteoporosis: The patient's osteoporosis was slightly better in the spine, but worse in the wrist in April 2014. Since her serum calcium was still below the 50% at her last visit, I asked her to increase her calcium and vitamin D to three times daily. Unfortunately, she only took the medication two times daily.  She can also take Tums at bedtime if she needs to without worrying about developing hypercalcemia.  2. Vitamin D deficiency: Her 25-OH vitamin D level was normal in March  3. Secondary hyperparathyroidism:  Her last PTH was good. I would like to keep her PTH in the lower half of the normal range, around a level of 25-40.   4. Goiter/thyroiditis: Thyroid gland is somewhat larger today. She says that her US in  [place] showed three small nodules. I've asked her to send me a copy. The waxing and waning of thyroid gland size and the episodic tenderness over time are both c/w intermittent flare ups of Hashimoto's Dz. Her recent TSH was borderline elevated and her total T3 was borderline low. We will repeat her TFTs now. It is likely that she will eventually need to be treated with thyroid hormone, similar to her siblings. 5. Hypocalcemia: Her serum calcium has remained at the lower end of the normal range. She needs more calcium and more vitamin D. I would still like to see it higher over time to provide her adequate amounts of calcium to foster better calcium uptake by bone. 6. Hypertension: Her BP is good today after resuming losartan, 25 mg/day.   7. Bradycardia: She says that her HR increases when she exercises. I warned her about the possibility of problems with her S-A node developing over time.  8. Pedal edema: She has no edema today.  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  10-11: Hyponatremia and hypochloremia: Her sodium was 135, chloride 95, and  potassium 4.7. It appears that she has been maintaining a low salt diet, but also been taking in much more free water. Alternatively, she could have a "re-set osmostat". I cautioned her not to take in excessive amounts of water.   12-13: Elevated B12 and folate: Although these levels are much higher than the reference range, they are not at all worrisome.   PLAN: 1. Diagnostic: Will repeat her PTH, calcium, 25-vitamin D, TFTs.  Order BMD again. 2. Therapeutic: Continue losartan at 25 mg (1/2 tablet) each AM. Increase calcium-D to three times daily. Continue to exercise. Watch fluid intake. 3. Patient education: We discussed the current recommendations from bone mineral density specialists as to calcium and vitamin D intake. I've asked her to try to increase the amount of calcium which she obtains from natural sources, such as milk, but also to take high quality calcium supplements such as Tums, Oscal, and Citracal. We also discussed the issues of hypertension, edema, goiter, and thyroiditis at length.  4. Follow-up: The patient will have a follow-up appointment in 6 months. She says that it is worth it to her to come back to [facility] to see me every 6 months.   Level of Service: This visit lasted in excess of 50 minutes. More than 50% of the visit was devoted to counseling.  David Stall

## 2016-05-25 NOTE — Addendum Note (Signed)
Addended by: David StallBRENNAN, Tayt Moyers J on: 05/25/2016 11:50 PM   Modules accepted: Orders

## 2016-05-26 ENCOUNTER — Telehealth: Payer: Self-pay | Admitting: "Endocrinology

## 2016-05-26 LAB — TSH: TSH: 1.11 m[IU]/L

## 2016-05-26 LAB — T4, FREE: FREE T4: 1.1 ng/dL (ref 0.8–1.8)

## 2016-05-26 LAB — T3, FREE: T3, Free: 1.8 pg/mL — ABNORMAL LOW (ref 2.3–4.2)

## 2016-05-27 LAB — VITAMIN D 25 HYDROXY (VIT D DEFICIENCY, FRACTURES): VIT D 25 HYDROXY: 47 ng/mL (ref 30–100)

## 2016-05-27 LAB — PTH, INTACT AND CALCIUM
CALCIUM: 8.6 mg/dL (ref 8.4–10.5)
PTH: 64 pg/mL (ref 14–64)

## 2016-05-27 NOTE — Telephone Encounter (Signed)
Routed to provider,  FYI. 

## 2016-08-23 ENCOUNTER — Telehealth: Payer: Self-pay | Admitting: "Endocrinology

## 2016-08-23 NOTE — Telephone Encounter (Signed)
1. I called the patient to discuss her recent lab tests.  2. Subjective: She is taking about 1200 of calcium citrate/day and 2 Tums at night.  3. Objective: Her last PTH was high-normal and her calcium was too low at 8.6. Her vitamin D was normal.  4. Assessment: She needs more calcium. 5. Plan: Increase calcium citrate to 1800 mg/day. Continue the 2 Tums at bedtime. Increase her consumption of dairy. Repeat BMD prior to her next visit in December. Labs in December. David StallBRENNAN,Raybon Conard J

## 2016-11-24 ENCOUNTER — Ambulatory Visit (INDEPENDENT_AMBULATORY_CARE_PROVIDER_SITE_OTHER): Payer: Self-pay | Admitting: "Endocrinology

## 2017-01-11 ENCOUNTER — Ambulatory Visit (INDEPENDENT_AMBULATORY_CARE_PROVIDER_SITE_OTHER): Payer: Self-pay | Admitting: "Endocrinology

## 2017-01-18 ENCOUNTER — Encounter (INDEPENDENT_AMBULATORY_CARE_PROVIDER_SITE_OTHER): Payer: Self-pay | Admitting: "Endocrinology

## 2017-01-18 ENCOUNTER — Ambulatory Visit (INDEPENDENT_AMBULATORY_CARE_PROVIDER_SITE_OTHER): Payer: Medicare Other | Admitting: "Endocrinology

## 2017-01-18 VITALS — BP 156/84 | HR 76 | Ht 68.78 in | Wt 150.2 lb

## 2017-01-18 DIAGNOSIS — E063 Autoimmune thyroiditis: Secondary | ICD-10-CM

## 2017-01-18 DIAGNOSIS — E211 Secondary hyperparathyroidism, not elsewhere classified: Secondary | ICD-10-CM

## 2017-01-18 DIAGNOSIS — R600 Localized edema: Secondary | ICD-10-CM | POA: Diagnosis not present

## 2017-01-18 DIAGNOSIS — M810 Age-related osteoporosis without current pathological fracture: Secondary | ICD-10-CM

## 2017-01-18 DIAGNOSIS — E871 Hypo-osmolality and hyponatremia: Secondary | ICD-10-CM

## 2017-01-18 DIAGNOSIS — E559 Vitamin D deficiency, unspecified: Secondary | ICD-10-CM | POA: Diagnosis not present

## 2017-01-18 DIAGNOSIS — E049 Nontoxic goiter, unspecified: Secondary | ICD-10-CM

## 2017-01-18 DIAGNOSIS — E878 Other disorders of electrolyte and fluid balance, not elsewhere classified: Secondary | ICD-10-CM

## 2017-01-18 NOTE — Progress Notes (Signed)
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 She was unaccompanied for today's visit.   1. The patient was referred to me on 07/15/2005 for evaluation of osteoporosis by her former primary care provider, Dr. [name], M.D.   A. The patient developed bilateral hip joint pains in the late 1990s. In 1996, Dr. [name] of [facility] ordered a bone mineral density study. That study showed significant osteoporosis in the spine and in both hips.  The patient was treated with calcium carbonate and with a multivitamin that included vitamin D. Despite taking these medications and having a reasonably robust exercise regimen, however, the patient's osteoporosis worsened. In 1999 Dr. [name]  performed bilateral hip replacements. Treatment with Fosamax was begun at about that time. In the intervening years bone mineral density had not changed significantly. She was treated with Boniva for several months, but just forgot to take it. Her past medical history revealed that she had had arthritis in many joints for many years. She had undergone natural menopause about 20 years prior. She did take estrogen for one or two years, then stopped that medication. Her surgeries included bilateral hip replacements and bilateral vein stripping. 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 Family history revealed that 5 of her 6 siblings had osteoporosis. Two of those had celiac disease when younger. A sister and brother had thyroid problems as well. [Addendum 04/04/13: In early 2014 all 6 of her siblings were hypothyroid and took thyroid hormone. One brother became hypothyroid after radioactive iodine therapy. A niece has been diagnosed with Graves' disease.]  B. Her physical examination was  completely unremarkable. Laboratory data revealed that her PTH was 75.5, which was at the upper limit of normal. Her calcium was 8.7, which was in the lower 15% of the normal range. Her 25-hydroxy vitamin D was 39 which was normal. Her 1,25 vitamin D was 51 which was also normal. I felt that she had relative hyperparathyroidism due to relatively low calcium and vitamin D intake. I asked her to increase her calcium and vitamin D intake. On subsequent testing on 09/30/2005, her PTH was 37.0, calcium 9.3, 25-vitamin D 47, and 1,25-vitamin D 55.   2. During the past twelve years the patient has done well overall. Because it was unclear how long she should remain on Fosamax treatment, I recommended that she obtain a second opinion from Dr. Gwendolyn Grant at [facility]. She saw Dr. Valarie Cones in June of 2009. He recommended that she not take either Fosamax or Boniva treatments any longer. He felt that she had derived all of the benefit from Fosamax that she could. He recommended that she continue her calcium, vitamin D, and exercise. Since then she has had a left hip revision procedure performed.  She continued to have follow-up visits with Dr. [name] in orthopedics. A bone mineral density study performed on 02/16/2011 showed that her bone mineral density probably declined about 2.4-3.7% in the previous 3 years. Since that study she has also had three hip dislocation procedures performed.   3.The patient's last [facility] visit was on 05/18/16. In the interim, "It's been difficult recently."   A. She had another attack of diverticulitis 2 weeks ago.  She had a celiac panel, EGD, and colonoscopy prior to her last visit. Mild antral gastritis and celiac disease  were diagnosed. She is now on a gluten-free diet. She has been constipated. She did note some blood on the toilet tissue recently and some mucus in the stool. She will be seen by her GI specialist in [place], Dr. Novella Rob, MD  soon.   B. She just doesn't feel well in the sense that she feels abdominal pressure when she is constipated. She resumed probiotics which helped her before. She will also resume Miralax.She also complains of LLQ pains, which she ascribes to her known hernia.   C. She stopped losartan for several months, but has resumed taking 25 mg/day (one-half of a 50 mg tablet). She also takes calcium with vitamin D, 600 mg of Caltrate-D, twice daly or Citracal-D twice daily and atorvastatin, 10 mg/day.  D.She has had intermittent leg edema.    E. She still walks about 2 miles per day. Unfortunately, she has severe arthritis in her right knee, which does not bother her on flat ground, but does bother her on stairs or on hills. She will see her new orthopedist soon.  F. 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  4. Pertinent Review of Systems: Constitutional: The patient feels "not as well". She no longer has much trapezius and nuchal tension since retiring and moving to [place], [place].    Eyes: Vision is good with her glasses. She had an eye exam in June 2017. There are no significant eye complaints. Neck: The patient has no other complaints of anterior neck swelling, soreness, tenderness,  pressure, discomfort, or difficulty swallowing.  Heart: Heart rate increases with exercise or other physical activity. The patient has no complaints of palpitations, irregular heat beats, chest pain, or chest pressure. Gastrointestinal: As above. She stopped the probiotics while taking Cipro for her diverticulitis. She is not having much reflux. The patient has no complaints of excessive hunger, upset stomach, stomach aches or pains, or diarrhea. She has a left inguinal hernia which may have worsened over time.  Legs: As above. She still has arthritis problems in the right knee. Muscle mass and strength seem normal. There are no complaints of numbness, tingling, or burning. She has occasional leg muscle  pains after walking. She has not had much edema in the legs unless she is sitting for along time.   Feet: There are no obvious foot problems, except for arthritis in her toes. There are no complaints of numbness, tingling, burning, or pain. No edema is noted.  PAST MEDICAL, FAMILY, AND SOCIAL HISTORY: 1. Work and family: The patient retired from [employer] in June 2016 and she and her husband moved to their new home near [place], [place] in July. There is not a local endocrinologist near her, but there are endocrinologists in [place], about 30 minutes away. I offered to refer her to some endocrinologists in [place], but she wants to continue to see me. 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 2. Activities: She is active physically. She tries to walk daily.   3. Primary care provider: She now sees Dr. Costella Hatcher in [facility] [place] [phone]. 4. Orthopedist: Dr. Myrtice Lauth in [place], [place]. 5. GI: Dr. Novella Rob, MD, in [place]  REVIEW OF SYSTEMS: There are no other significant problems involving her other body systems.  PHYSICAL EXAM: BP (!) 156/84   Pulse 76   Ht 5' 8.78" (1.747 m)   Wt 150 lb 3.2 oz (68.1 kg)   BMI 22.32 kg/m  She forgot her BP meds when she left home on Friday, 01/15/17.  Constitutional: The patient looks healthy, physically and emotionally well.  Her weight is 1.5 pounds higher. She is alert, bright, and very mentally sharp.  Eyes: There is no arcus or proptosis.  Mouth: The oropharynx appears normal. The tongue appears normal. There is normal oral moisture. There is no obvious gingivitis. Neck: There are no bruits present. The thyroid gland appears normal in size. The thyroid gland is smaller at about 18-20 grams in size. Both lobes are within normal limits for size. The consistency of the thyroid gland is normal today. There is no discomfort in her thyroid gland today.The left side of her hyoid bone is more prominent  than the right side.  Her trapezius muscles are still somewhat tight, but are not tender today.  Lungs: The lungs are clear. Air movement is good. Heart: The heart rhythm and rate appear normal. Heart sounds S1 and S2 are normal. I do not appreciate any pathologic heart murmurs. Abdomen: The abdomen is still somewhat enlarged in size, but appropriate for age.  Bowel sounds are normal. The abdomen is soft and non-tender. There is no obviously palpable hepatomegaly, splenomegaly, or other masses.  Arms: Muscle mass appears appropriate for age.  Hands: There is no obvious tremor. Phalangeal and metacarpophalangeal joints appear normal. Palms are normal. Legs: Muscle mass appears appropriate for age. There is just a trace of edema bilaterally. There is no shin tenderness bilaterally. Neurologic: Muscle strength is normal for age and gender  in both the upper and the lower extremities. Muscle tone appears normal. Sensation to touch is normal in the legs.   Lab Data:  Labs 11/06/16: CBC normal; CMP normal with sodium 137, chloride  99, and calcium 9.1, cholesterol 151, triglycerides 23, HDL 78, LDL 68; vitamin B12 1,563 (ref 211-946), folate >20 (ref >3.0); TSH 2.290, free T4 1.16, free T3 2.3 (ref 2.0-4.4)  Labs 05/25/16: PTH 64 (ref 14-614), calcium 8.6, 25-OH vitamin D 47; TSH 1.11, free T4 1.1, free T3 1.8 (ref 2.3-4.2)  Labs about 02/14/16: CBC normal, CMP normal, cholesterol 147, triglycerides 40, HDL 74, LDL 65; vitamin B12 1565 (ref 211-946), folate >20 (ref >3); TSH 3.45, free T4 1.23, total T3 77 (ref 71-180); calcium 9.3, 25-OH vitamin D 43.8  Labs 11/15/15: PTH 36, calcium 8.8, 1,25-OH vitamin D 39; CMP normal, except for sodium 130, chloride 94, CO2 33, glucose 140; TSH 2.629, free T4 1.40, free T3 2.4  Labs 04/15/15: Calcium 9.3, PTH 29, 25-OH 46; CMP normal; TSH 2.052, free T4 0.98, free T3 2.6  Labs 10/01/14: PTH 47, calcium 9.3, 25-hydroxy vitamin D 62, CMP with creatinine 1.16; TSH  2.60, free T4 1.14, 2.8  Labs 04/02/14: PTH 43.8, calcium 9.3, 25-hydroxy vitamin D 54, sodium 134; TSH 3.197, free T4 1.05, free T3 2.4  Labs 09/22/33: PTH 34, calcium 9.1, 25-hydroxy vitamin D 58, alkaline phosphatase 54, sodium 134, TSH 1.869, free T4 1.11, free T3 2.4  Labs 02/28/13: PTH 70.5, calcium 9.3, 25-hydroxy vitamin D 50, 1,25-dihydroxy vitamin D 64,   Labs 09/23/12: PTH 34.6, calcium 9.4, 25-hydroxy vitamin D 49, 1,25-dihydroxy vitamin D 53, TSH 1.767, free T4 1.21, free T3 2.6  Labs 03/24/12: PTH 15.3, calcium 9.8, 25-hydroxy vitamin D 56, 1.25-dihydroxy vitamin D 40, TSH 1.254, free T4 1.29, free T3 2.5.   03/29/13 Dexa scan: The patient's bone mineral density study performed showed a 2.6% increase of BMD in the spine, but a 9.4% decrease in  the wrist.   IMAGING:  CT scan in December 2017: Normal neck  ASSESSMENT: 1. Osteoporosis: The patient's osteoporosis was slightly better in the spine, but worse in the wrist in April 2014. Since her serum calcium was still below the 50% at her last visit, I asked her to increase her calcium and vitamin D to three times daily. Unfortunately, she only took the medication two times daily.  She can also take Tums at bedtime if she needs to without worrying about developing hypercalcemia.  2. Vitamin D deficiency: Her 25-OH vitamin D level was normal in March ane in June 2017.  3-4. Secondary hyperparathyroidism/hypocalcemia:    A. Her PTH in June was normal, but high-normal. Her calcium was low-normal.  The higher PTH reflected the lower calcium level, which in turn was due to lower calcium intake. 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  B. I would like to keep her PTH in the lower half of the normal range, around a level of 25-40 an her calcium in the upper half of the normal range, around a level of 9.5-10.0.   5-6. Goiter/thyroiditis:   A. Thyroid gland is smaller today. The waxing  and waning of thyroid gland size and the episodic tenderness over time are both c/w intermittent flare ups of Hashimoto's Dz.   B. She said at her last visit that her [place] in [place] showed three small nodules. The CT scan in December did not show any nodules. I suspect that the radiologist who read the [place] mistook areas of heterogeneity for nodules.   C. Her TSH was values in June and December 2017 were normal.  Her free T4 and free T3 in December were also normal. Although she does not need treatment with thyroid hormone at this time, it is likely that she will eventually need to be treated with thyroid hormone, similar to her siblings. 7. Hypertension: Her BP is high today after missing several doses of losartan.    8. Bradycardia: Her heart rate is normal today. She says that her HR has been fine.   9. Pedal edema: She has only a trace of edema today.  10. 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  11-12: Hyponatremia and hypochloremia: Her recent sodium was 137, chloride 99, and potassium 4.7. These values are normal. It appeared in the past that she had been maintaining a low salt diet, but also been taking in much more free water.  13-14: Elevated B12 and folate: Although these levels are much higher than the reference range, they are not at all worrisome.  15. Constipation: Calcium citrate is less constipating than calcium carbonate. I recommended that she take Citracal-D, 600 mg, twice daily  PLAN: 1. Diagnostic: Will repeat her PTH and calcium  Once she tells [place] where she wants to have her BMD done in [place], we will send that order.   2. Therapeutic: Continue losartan at 25 mg (1/2 tablet) each AM. Continue calcium-D twice daily. Continue to exercise. Watch fluid intake. 3. Patient education: We discussed the current recommendations from bone mineral density specialists as to calcium and vitamin D intake. I've asked her to try to  increase the amount of calcium which she obtains from natural sources, such as milk, but also to take high quality calcium supplements such as Tums, Oscal, and Citracal. We also discussed the issues of hypertension, edema, goiter, and thyroiditis at length.  4. Follow-up: The patient will have a follow-up appointment in 6 months. She says that it is worth it to her to come back to [facility] to see me every 6 months.   Level of Service: This visit lasted in excess of 50 minutes. More than 50% of the visit was devoted to counseling.  Molli Knock, MD, CDE Adult and Pediatric Endocrinology

## 2017-01-18 NOTE — Patient Instructions (Signed)
Follow up visit in 6 months. 

## 2017-01-19 LAB — PTH, INTACT AND CALCIUM
CALCIUM: 9 mg/dL (ref 8.6–10.4)
PTH: 26 pg/mL (ref 14–64)

## 2017-02-01 ENCOUNTER — Encounter (INDEPENDENT_AMBULATORY_CARE_PROVIDER_SITE_OTHER): Payer: Self-pay | Admitting: *Deleted

## 2017-08-23 ENCOUNTER — Ambulatory Visit (INDEPENDENT_AMBULATORY_CARE_PROVIDER_SITE_OTHER): Payer: Medicare Other | Admitting: "Endocrinology

## 2017-08-23 ENCOUNTER — Encounter (INDEPENDENT_AMBULATORY_CARE_PROVIDER_SITE_OTHER): Payer: Self-pay | Admitting: "Endocrinology

## 2017-08-23 VITALS — BP 120/72 | HR 68 | Ht 68.11 in | Wt 154.1 lb

## 2017-08-23 DIAGNOSIS — I10 Essential (primary) hypertension: Secondary | ICD-10-CM | POA: Diagnosis not present

## 2017-08-23 DIAGNOSIS — M810 Age-related osteoporosis without current pathological fracture: Secondary | ICD-10-CM

## 2017-08-23 DIAGNOSIS — N2581 Secondary hyperparathyroidism of renal origin: Secondary | ICD-10-CM

## 2017-08-23 DIAGNOSIS — E871 Hypo-osmolality and hyponatremia: Secondary | ICD-10-CM

## 2017-08-23 DIAGNOSIS — E063 Autoimmune thyroiditis: Secondary | ICD-10-CM

## 2017-08-23 DIAGNOSIS — R600 Localized edema: Secondary | ICD-10-CM | POA: Diagnosis not present

## 2017-08-23 DIAGNOSIS — K5909 Other constipation: Secondary | ICD-10-CM

## 2017-08-23 DIAGNOSIS — E049 Nontoxic goiter, unspecified: Secondary | ICD-10-CM | POA: Diagnosis not present

## 2017-08-23 DIAGNOSIS — E559 Vitamin D deficiency, unspecified: Secondary | ICD-10-CM

## 2017-08-23 NOTE — Progress Notes (Signed)
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 She was unaccompanied for today's visit.   1. The patient was referred to me on 07/15/2005 for evaluation of osteoporosis by her former primary care provider, Dr. [name], M.D.   A. The patient developed bilateral hip joint pains in the late 1990s. In 1996, Dr. [name] of [facility] ordered a bone mineral density study. That study showed significant osteoporosis in the spine and in both hips.  The patient was treated with calcium carbonate and with a multivitamin that included vitamin D. Despite taking these medications and having a reasonably robust exercise regimen, however, the patient's osteoporosis worsened. In 1999 Dr. [name]  performed bilateral hip replacements. Treatment with Fosamax was begun at about that time. In the intervening years bone mineral density had not changed significantly. She was treated with Boniva for several months, but just forgot to take it. Her past medical history revealed that she had had arthritis in many joints for many years. She had undergone natural menopause about 20 years prior. She did take estrogen for one or two years, then stopped that medication. Her surgeries included bilateral hip replacements and bilateral vein stripping. 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 Family history revealed that 5 of her 6 siblings had osteoporosis. Two of those had celiac disease when younger. A sister and brother had thyroid problems as well. [Addendum 04/04/13: In early 2014 all 6 of her siblings were hypothyroid and took thyroid hormone. One brother became hypothyroid after radioactive iodine therapy. A niece has been diagnosed with Graves' disease.]  B. Her physical examination was  completely unremarkable. Laboratory data revealed that her PTH was 75.5, which was at the upper limit of normal. Her calcium was 8.7, which was in the lower 15% of the normal range. Her 25-hydroxy vitamin D was 39 which was normal. Her 1,25 vitamin D was 51 which was also normal. I felt that she had relative hyperparathyroidism due to relatively low calcium and vitamin D intake. I asked her to increase her calcium and vitamin D intake. On subsequent testing on 09/30/2005, her PTH was 37.0, calcium 9.3, 25-vitamin D 47, and 1,25-vitamin D 55.   2. During the past twelve years the patient has done well overall. Because it was unclear how long she should remain on Fosamax treatment, I recommended that she obtain a second opinion from Dr. Gwendolyn Grant at [facility]. She saw Dr. Valarie Cones in June of 2009. He recommended that she not take either Fosamax or Boniva treatments any longer. He felt that she had derived all of the benefit from Fosamax that she could. He recommended that she continue her calcium, vitamin D, and exercise. Since then she has had a left hip revision procedure performed.  She continued to have follow-up visits with Dr. [name] in orthopedics. A bone mineral density study performed on 02/16/2011 showed that her bone mineral density probably declined about 2.4-3.7% in the previous 3 years. Since that study she has also had three hip dislocation procedures performed.   3.The patient's last [facility] visit was on 01/18/17. In the interim, she did not have her BMD done because she was told that she needed to wait for another 6 months in order for the study to be approved.   A. She had a right total  knee replacement in July 2018. She still has swelling and some soreness.   B. She has not had any further attacks of diverticulitis. She remains on her gluten-free diet for the most part. She has not been constipated since she began using her new probiotic. She had not noted  any further blood on the toilet tissue. She continues to be followed by her GI specialist in [place], Dr. Novella Rob, MD. She no longer has LLQ pains.   C. She takes losartan, 25 mg/day (one-half of a 50 mg tablet). She also takes calcium with vitamin D, 600 mg of Caltrate-D, twice daly or Citracal-D twice daily and gummi-Tums 3-4 times per week. . She stopped her atorvastatin, 10 mg/day, because her cholesterol was doing so well.   D. She has had intermittent leg edema.    E. She is able to walk better on flat ground, but no so well in the hills of the [place].  F. 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  4. Pertinent Review of Systems: Constitutional: The patient feels "good overall". She gets frustrated when she has to be inactive as she has been since her knee surgery.  Eyes: Vision is good with her glasses. She had an eye exam in June 2018. There are no significant eye complaints. Neck: The patient has no other complaints of anterior neck swelling, soreness, tenderness,  pressure, discomfort, or difficulty swallowing.  Heart: Heart rate increases with exercise or other physical activity. The patient has no complaints of palpitations, irregular heat beats, chest pain, or chest pressure. Gastrointestinal: As above. She is not having much reflux. The patient has no complaints of excessive hunger, upset stomach, stomach aches or pains, or diarrhea. She has a left inguinal hernia which has not been acting up lately.   Legs: As above.  Muscle mass and strength seem normal. She has episodic pains running down her lateral right leg and down into her right foot. Her PT told her that it might be coming from her hip.There are no complaints of numbness, tingling, or burning. She has occasional leg muscle pains after walking. She has not had much edema in the legs unless she is sitting for along time.   Feet: There are no obvious foot problems, except for arthritis in her toes.  There are no complaints of numbness, tingling, burning, or pain. She has had some right foot edema since her knee replacement.  PAST MEDICAL, FAMILY, AND SOCIAL HISTORY: 1. Work and family: The patient retired from [employer] in June 2016 and she and her husband moved to their new home near [place], [place] in July. There is not a local endocrinologist near her, but there are endocrinologists in [place], about 30 minutes away. I offered to refer her to some endocrinologists in [place], but she wants to continue to see me. 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 2. Activities: She is active physically. She tries to walk as much as she can.   3. Primary care provider: She now sees Dr. Costella Hatcher in [facility] at [place] [phone]. 4. Orthopedist: Dr. Macarthur Critchley in [place], [place]. 5. GI: Dr. Novella Rob, MD, in [place] 6. She has the [facility] Medicare Advantage Supplement  REVIEW OF SYSTEMS: There are no other significant problems involving her other body systems.  PHYSICAL EXAM: BP 120/72   Pulse 68   Ht 5' 8.11" (1.73 m)   Wt 154 lb 2 oz (69.9 kg)   BMI 23.36  kg/m    Constitutional: The patient looks healthy, physically and emotionally well.  Her weight is 4 pounds higher. She is alert, bright, and very mentally sharp.  Eyes: There is no arcus or proptosis.  Mouth: The oropharynx appears normal. The tongue appears normal. There is normal oral moisture. There is no obvious gingivitis. Neck: There are no bruits present. The thyroid gland appears normal in size. The thyroid gland is a bit larger at 20+ grams in size. The right lobe is within normal limits again, but the left lobe is a bit larger. The consistency of the thyroid gland is normal today. There is a sensation of uncomfortable pressure when I palpate her right lobe today, but not when I palpate her left lobe. Her trapezius muscles are still somewhat tight, but are not tender today.  Lungs: The lungs are clear. Air  movement is good. Heart: The heart rhythm and rate appear normal. Heart sounds S1 and S2 are normal. I do not appreciate any pathologic heart murmurs. Abdomen: The abdomen is still somewhat enlarged in size, but appropriate for age.  Bowel sounds are normal. The abdomen is soft and non-tender. There is no obviously palpable hepatomegaly, splenomegaly, or other masses.  Arms: Muscle mass appears appropriate for age.  Hands: There is no obvious tremor. Phalangeal and metacarpophalangeal joints appear normal. Palms are normal. Legs: Muscle mass appears appropriate for age. There is just a trace of edema on the right today.  Neurologic: Muscle strength is normal for age and gender  in both the upper and the lower extremities. Muscle tone appears normal. Sensation to touch is normal in the legs.   Lab Data:  Labs 01/18/17: PTH 26, calcium 9.0  Labs 11/06/16: CBC normal; CMP normal with sodium 137, chloride  99, and calcium 9.1, cholesterol 151, triglycerides 23, HDL 78, LDL 68; vitamin B12 1,563 (ref 211-946), folate >20 (ref >3.0); TSH 2.290, free T4 1.16, free T3 2.3 (ref 2.0-4.4)  Labs 05/25/16: PTH 64 (ref 14-614), calcium 8.6, 25-OH vitamin D 47; TSH 1.11, free T4 1.1, free T3 1.8 (ref 2.3-4.2)  Labs about 02/14/16: CBC normal, CMP normal, cholesterol 147, triglycerides 40, HDL 74, LDL 65; vitamin B12 1565 (ref 211-946), folate >20 (ref >3); TSH 3.45, free T4 1.23, total T3 77 (ref 71-180); calcium 9.3, 25-OH vitamin D 43.8  Labs 11/15/15: PTH 36, calcium 8.8, 1,25-OH vitamin D 39; CMP normal, except for sodium 130, chloride 94, CO2 33, glucose 140; TSH 2.629, free T4 1.40, free T3 2.4  Labs 04/15/15: Calcium 9.3, PTH 29, 25-OH 46; CMP normal; TSH 2.052, free T4 0.98, free T3 2.6  Labs 10/01/14: PTH 47, calcium 9.3, 25-hydroxy vitamin D 62, CMP with creatinine 1.16; TSH 2.60, free T4 1.14, 2.8  Labs 04/02/14: PTH 43.8, calcium 9.3, 25-hydroxy vitamin D 54, sodium 134; TSH 3.197, free T4 1.05, free  T3 2.4  Labs 09/22/33: PTH 34, calcium 9.1, 25-hydroxy vitamin D 58, alkaline phosphatase 54, sodium 134, TSH 1.869, free T4 1.11, free T3 2.4  Labs 02/28/13: PTH 70.5, calcium 9.3, 25-hydroxy vitamin D 50, 1,25-dihydroxy vitamin D 64,   Labs 09/23/12: PTH 34.6, calcium 9.4, 25-hydroxy vitamin D 49, 1,25-dihydroxy vitamin D 53, TSH 1.767, free T4 1.21, free T3 2.6  Labs 03/24/12: PTH 15.3, calcium 9.8, 25-hydroxy vitamin D 56, 1.25-dihydroxy vitamin D 40, TSH 1.254, free T4 1.29, free T3 2.5.   03/29/13 Dexa scan: The patient's bone mineral density study performed showed a 2.6% increase of BMD in the spine, but  a 9.4% decrease in the wrist.   IMAGING:  CT scan in December 2017: Normal neck  BMD 05/13/15: Report not available  ASSESSMENT: 1. Osteoporosis: The patient's osteoporosis was slightly better in the spine, but worse in the wrist in April 2014. Since her serum calcium was still below the 50% at her last visit, I asked her to increase her calcium and vitamin D to three times daily. Unfortunately, she still only takes the medication two times daily.  She can also take the gummi-Tums at bedtime if she needs to without worrying about developing hypercalcemia.  2. Vitamin D deficiency: Her 25-OH vitamin D level was normal in March and in June 2017.  3-4. Secondary hyperparathyroidism/hypocalcemia:    A. Her PTH in June 2017 was normal, but high-normal. Her calcium was low-normal.  The higher PTH reflected the lower calcium level, which in turn was due to lower calcium intake. 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  B. Her subsequent PTH level in February 2018 was normal, but much lower at 26.   C. I would like to keep her PTH in the lower half of the normal range, around a level of 25-40 and her calcium in the upper half of the normal range, around a level of 9.5-10.0.  She needs to be very consistent about taking her calcium.  5-6.  Goiter/thyroiditis:   A. Thyroid gland is a bit larger today. The right lobe is also uncomfortable to palpation today. The waxing and waning of thyroid gland size and the episodic tenderness/discomfort over time are both c/w intermittent flare ups of Hashimoto's Dz.   B. She said at her prior visit that her [place] in [place] showed three small nodules. The CT scan in December did not show any nodules. I suspect that the radiologist who read the [place] mistook areas of heterogeneity for nodules.   C. Her TSH values in June and December 2017 were normal.  Her free T4 and free T3 in December were also normal. Although she did not need treatment with thyroid hormone at that time, it is likely that she will eventually need to be treated with thyroid hormone, similar to her siblings. 7. Hypertension: Her BP is normal today.   8. Bradycardia: Her heart rate is normal today. She says that her HR has been fine.   9. Pedal edema: She has only a trace of edema today of her lower right leg today. This level of edema is certainly not unusual two months after knee replacement. 10. Trapezius spasm: The trapezius muscles are still somewhat tight, but are not tender to palpation. This is a chronic problem for her, but has been much better since she retired.  11-12: Hyponatremia and hypochloremia: Her sodium was 137, chloride 99, and potassium 4.7 in December 2017. These values were normal. It appeared in the past that she had been maintaining a low salt diet, but also been taking in much more free water.  13-14: Elevated B12 and folate: Although these levels are much higher than the reference range, they are not at all worrisome.  15. Constipation: This problem has resolved with her probiotic.   PLAN: 1. Diagnostic: Will repeat her TFTs, CMP, vitamin D, PTH and calcium  We will repeat the order for a BMD at the [facility] in supply, [place].    2. Therapeutic: Continue losartan at 25 mg (1/2 tablet) each AM. Continue  calcium-D twice daily and the gummi-tums.. Continue to exercise.  Watch fluid intake. 3. Patient education: We discussed the current recommendations from bone mineral density specialists as to calcium and vitamin D intake. I've asked her to try to increase the amount of calcium which she obtains from natural sources, such as milk, but also to take high quality calcium supplements such as Tums and Citracal. We also discussed the issues of hypertension, edema, goiter, and thyroiditis at length.  4. Follow-up: The patient will have a follow-up appointment in 6 months. She again stated that it is worth it to her to come back to [facility] to see me every 6 months.   Level of Service: This visit lasted in excess of 55 minutes. More than 50% of the visit was devoted to counseling.  Molli Knock, MD, CDE Adult and Pediatric Endocrinology

## 2017-08-23 NOTE — Patient Instructions (Signed)
Follow up visit in 6 months. Please repeat labs 2 weeks prior.

## 2017-08-24 LAB — COMPREHENSIVE METABOLIC PANEL
AG Ratio: 1.4 (calc) (ref 1.0–2.5)
ALBUMIN MSPROF: 3.9 g/dL (ref 3.6–5.1)
ALT: 12 U/L (ref 6–29)
AST: 22 U/L (ref 10–35)
Alkaline phosphatase (APISO): 77 U/L (ref 33–130)
BUN: 21 mg/dL (ref 7–25)
CHLORIDE: 99 mmol/L (ref 98–110)
CO2: 27 mmol/L (ref 20–32)
Calcium: 9.2 mg/dL (ref 8.6–10.4)
Creat: 0.89 mg/dL (ref 0.60–0.93)
GLOBULIN: 2.8 g/dL (ref 1.9–3.7)
GLUCOSE: 92 mg/dL (ref 65–139)
Potassium: 4.9 mmol/L (ref 3.5–5.3)
Sodium: 132 mmol/L — ABNORMAL LOW (ref 135–146)
TOTAL PROTEIN: 6.7 g/dL (ref 6.1–8.1)
Total Bilirubin: 0.5 mg/dL (ref 0.2–1.2)

## 2017-08-24 LAB — PTH, INTACT AND CALCIUM
Calcium: 9.1 mg/dL (ref 8.6–10.4)
PTH: 21 pg/mL (ref 14–64)

## 2017-08-24 LAB — T4, FREE: Free T4: 1 ng/dL (ref 0.8–1.8)

## 2017-08-24 LAB — TSH: TSH: 2.02 m[IU]/L (ref 0.40–4.50)

## 2017-08-24 LAB — T3, FREE: T3 FREE: 2.5 pg/mL (ref 2.3–4.2)

## 2017-08-24 LAB — VITAMIN D 25 HYDROXY (VIT D DEFICIENCY, FRACTURES): VIT D 25 HYDROXY: 42 ng/mL (ref 30–100)

## 2017-08-30 ENCOUNTER — Encounter (INDEPENDENT_AMBULATORY_CARE_PROVIDER_SITE_OTHER): Payer: Self-pay | Admitting: *Deleted

## 2018-02-21 ENCOUNTER — Ambulatory Visit (INDEPENDENT_AMBULATORY_CARE_PROVIDER_SITE_OTHER): Payer: Medicare Other | Admitting: "Endocrinology

## 2018-02-21 ENCOUNTER — Encounter (INDEPENDENT_AMBULATORY_CARE_PROVIDER_SITE_OTHER): Payer: Self-pay | Admitting: "Endocrinology

## 2018-02-21 VITALS — BP 124/78 | HR 82 | Ht 68.03 in | Wt 154.8 lb

## 2018-02-21 DIAGNOSIS — M810 Age-related osteoporosis without current pathological fracture: Secondary | ICD-10-CM

## 2018-02-21 DIAGNOSIS — E049 Nontoxic goiter, unspecified: Secondary | ICD-10-CM | POA: Diagnosis not present

## 2018-02-21 DIAGNOSIS — R600 Localized edema: Secondary | ICD-10-CM

## 2018-02-21 DIAGNOSIS — E063 Autoimmune thyroiditis: Secondary | ICD-10-CM | POA: Diagnosis not present

## 2018-02-21 DIAGNOSIS — I10 Essential (primary) hypertension: Secondary | ICD-10-CM | POA: Diagnosis not present

## 2018-02-21 NOTE — Patient Instructions (Signed)
Follow up visit in 6 months. 

## 2018-02-21 NOTE — Progress Notes (Signed)
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 She was unaccompanied for today's visit.   1. The patient was referred to me on 07/15/2005 for evaluation of osteoporosis by her former primary care provider, Dr. [name], M.D.   A. The patient developed bilateral hip joint pains in the late 1990s. In 1996, Dr. [name] of [facility] ordered a bone mineral density study. That study showed significant osteoporosis in the spine and in both hips.  The patient was treated with calcium carbonate and with a multivitamin that included vitamin D. Despite taking these medications and having a reasonably robust exercise regimen, however, the patient's osteoporosis worsened. In 1999 Dr. [name]  performed bilateral hip replacements. Treatment with Fosamax was begun at about that time. In the intervening years bone mineral density had not changed significantly. She was treated with Boniva for several months, but just forgot to take it. Her past medical history revealed that she had had arthritis in many joints for many years. She had undergone natural menopause about 20 years prior. She did take estrogen for one or two years, then stopped that medication. Her surgeries included bilateral hip replacements and bilateral vein stripping. 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 Family history revealed that 5 of her 6 siblings had osteoporosis. Two of those had celiac disease when younger. A sister and brother had thyroid problems as well. [Addendum 04/04/13: In early 2014 all 6 of her siblings were hypothyroid and took thyroid hormone. One of the six siblings, a brother, became hypothyroid after radioactive iodine therapy. A niece has been diagnosed with Graves' disease.]  B. Her  physical examination was completely unremarkable. Laboratory data revealed that her PTH was 75.5, which was at the upper limit of normal. Her calcium was 8.7, which was in the lower 15% of the normal range. Her 25-hydroxy vitamin D was 39 which was normal. Her 1,25 vitamin D was 51 which was also normal. I felt that she had relative hyperparathyroidism due to relatively low calcium and vitamin D intake. I asked her to increase her calcium and vitamin D intake. On subsequent testing on 09/30/2005, her PTH was 37.0, calcium 9.3, 25-vitamin D 47, and 1,25-vitamin D 55.   2. During the past thirteen years the patient has done well overall. Because it was unclear how long she should remain on Fosamax treatment, I recommended that she obtain a second opinion from Dr. Gwendolyn Grant, a bone-mineral expert at the [facility]. She saw Dr. Valarie Cones in June of 2009. He recommended that she not take either Fosamax or Boniva treatments any longer. He felt that she had derived all of the benefit from bisphosphonates that she could. He recommended that she continue her calcium, vitamin D, and exercise. Since then she has had a left hip revision procedure performed.  She continued to have follow-up visits with Dr. [name] in orthopedics. A bone mineral density study performed on 02/16/2011 showed that her bone mineral density probably declined about 2.4-3.7% in the previous 3 years. Since that study she has also had three hip dislocation procedures performed.   3.The patient's last [facility] visit was on 08/23/17. In the interim, she was told to stop her cholesterol medication because her lipids were good. She then decided to stop her BP medication. Her BP then increased to 180/90,  so she is back on losartan, 50 mg/day and her BPs have improved.    A. She had a right total knee replacement in July 2018. Her post-operative swelling and soreness have largely resolved..   B. She had another attack of  diverticulitis in November. Her surgeon has advised her to have surgery. Her orthopedist told her that he is worried that colonic surgery could result in an infection that might adversely affect her joint replacements.   C. She remains on her gluten-free diet for the most part. She has not been constipated since she began using her new probiotic. She had not noted any further blood on the toilet tissue. She continues to be followed by her GI specialist in [place], Dr. Novella Rob, MD. She no longer has LLQ pains.   D. She takes losartan, 50 mg/day. She also takes calcium with vitamin D, 600 mg of Caltrate-D, twice daily or Citracal-D twice daily. She had not been having reflux, so she stopped the gummi-Tums 3-4 times per week.   E. She has had intermittent leg edema.    F. She is able to walk better on both  flat ground and on hills.   G. 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  4. Pertinent Review of Systems: Constitutional: The patient feels "good overall". She has been much more physically active.   Eyes: Vision is good with her glasses. She had an eye exam in June 2018. There are no significant eye complaints. Neck: The patient has no other complaints of anterior neck swelling, soreness, tenderness,  pressure, discomfort, or difficulty swallowing.  Heart: Heart rate increases with exercise or other physical activity. The patient has no complaints of palpitations, irregular heat beats, chest pain, or chest pressure. Gastrointestinal: As above. She is not having much reflux. The patient has no complaints of excessive hunger, upset stomach, stomach aches or pains, or diarrhea. She has a left inguinal hernia which has not been acting up lately.   Legs: Muscle mass and strength seem normal. She has not had many episodic pains running down her lateral right leg and down into her right foot. There are no complaints of numbness, tingling, or burning. She has not had much edema  in the legs unless she is sitting or standing for a long period of time.   Feet: There are no obvious foot problems, except for arthritis in her toes. There are no complaints of numbness, tingling, burning, or pain. She has not had much right foot edema since her last visit.   PAST MEDICAL, FAMILY, AND SOCIAL HISTORY: 1. Work and family: The patient retired from [employer] in June 2016 and she and her husband moved to their new home near [place], [place] in July. There is not a local endocrinologist near her, but there are endocrinologists in [place], about 30 minutes away. I offered to refer her to some endocrinologists in [place], but she wants to continue to see me. 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 2. Activities: She is active physically. She walks at least 1-2 miles 4 times per week.    3. Primary care provider: She now sees Dr. Costella Hatcher in [facility] at [place] [phone]. 4. Orthopedist: Dr. Macarthur Critchley in [place], [place]. 5. GI: Dr. Novella Rob, MD, in [place] 6. She has the [facility] Medicare Advantage Supplement  REVIEW OF SYSTEMS: There are no other significant problems involving her other body systems.  PHYSICAL EXAM: BP 124/78  Pulse 82   Ht 5' 8.03" (1.728 m)   Wt 154 lb 12.8 oz (70.2 kg)   BMI 23.52 kg/m    Constitutional: The patient looks healthy, physically and emotionally well.  Her weight is 10 ounces higher. She is alert, bright, and very mentally sharp. It is always a pleasure to see her.  Eyes: There is no arcus or proptosis.  Mouth: The oropharynx appears normal. The tongue appears normal. There is normal oral moisture. There is no obvious gingivitis. Neck: There are no bruits present. The thyroid gland appears normal in size. The thyroid gland is a bit larger at 21 grams in size. The right lobe is within normal limits again, but the left lobe is larger. The consistency of the thyroid gland is normal today. There is a sensation of uncomfortable  pressure when I palpate her right lobe today, but not when I palpate her left lobe. Lungs: The lungs are clear. Air movement is good. Heart: The heart rhythm and rate appear normal. Heart sounds S1 and S2 are normal. I do not appreciate any pathologic heart murmurs. Abdomen: The abdomen is still somewhat enlarged in size, but appropriate for age.  Bowel sounds are normal. The abdomen is soft and non-tender. There is no obviously palpable hepatomegaly, splenomegaly, or other masses.  Arms: Muscle mass appears appropriate for age.  Hands: There is no obvious tremor. Phalangeal and metacarpophalangeal joints appear normal. Palms are normal. Legs: Muscle mass appears appropriate for age. There is no edema today.  Neurologic: Muscle strength is normal for age and gender  in both the upper and the lower extremities. Muscle tone appears normal. Sensation to touch is normal in the legs.   Lab Data:  Labs 02/21/18: PTH 36, calcium 9.0, 25-OH vitamin D 39   Labs 12/22/17: Sodium 136, chloride 95, potassium 5.0, CO2 24, creatinine 0.87, calcium 9.3; TSH 2.85  Labs 08/23/17: TSH 2.02, free T4 1.0, free T3 2.5; PTH 21 (ref 14-64), calcium 9.1 (ref 8.6-10.4), 25-OH vitamin 42 ; CMP normal except for sodium 132  Labs 01/18/17: PTH 26, calcium 9.0  Labs 11/06/16: CBC normal; CMP normal with sodium 137, chloride  99, and calcium 9.1, cholesterol 151, triglycerides 23, HDL 78, LDL 68; vitamin B12 1,563 (ref 211-946), folate >20 (ref >3.0); TSH 2.290, free T4 1.16, free T3 2.3 (ref 2.0-4.4)  Labs 05/25/16: PTH 64 (ref 14-614), calcium 8.6, 25-OH vitamin D 47; TSH 1.11, free T4 1.1, free T3 1.8 (ref 2.3-4.2)  Labs about 02/14/16: CBC normal, CMP normal, cholesterol 147, triglycerides 40, HDL 74, LDL 65; vitamin B12 1565 (ref 211-946), folate >20 (ref >3); TSH 3.45, free T4 1.23, total T3 77 (ref 71-180); calcium 9.3, 25-OH vitamin D 43.8  Labs 11/15/15: PTH 36, calcium 8.8, 1,25-OH vitamin D 39; CMP normal, except  for sodium 130, chloride 94, CO2 33, glucose 140; TSH 2.629, free T4 1.40, free T3 2.4  Labs 04/15/15: Calcium 9.3, PTH 29, 25-OH 46; CMP normal; TSH 2.052, free T4 0.98, free T3 2.6  Labs 10/01/14: PTH 47, calcium 9.3, 25-hydroxy vitamin D 62, CMP with creatinine 1.16; TSH 2.60, free T4 1.14, 2.8  Labs 04/02/14: PTH 43.8, calcium 9.3, 25-hydroxy vitamin D 54, sodium 134; TSH 3.197, free T4 1.05, free T3 2.4  Labs 09/22/33: PTH 34, calcium 9.1, 25-hydroxy vitamin D 58, alkaline phosphatase 54, sodium 134, TSH 1.869, free T4 1.11, free T3 2.4  Labs 02/28/13: PTH 70.5, calcium 9.3, 25-hydroxy vitamin D 50, 1,25-dihydroxy vitamin D 64,  Labs 09/23/12: PTH 34.6, calcium 9.4, 25-hydroxy vitamin D 49, 1,25-dihydroxy vitamin D 53, TSH 1.767, free T4 1.21, free T3 2.6  Labs 03/24/12: PTH 15.3, calcium 9.8, 25-hydroxy vitamin D 56, 1.25-dihydroxy vitamin D 40, TSH 1.254, free T4 1.29, free T3 2.5.    IMAGING:  01/17/18 Dex scan: BMD of L1-L4 is minus 4.1 SDs. BMD of the left radius is minus 5.1 SDs. BMD of the right radius is minus 4.2 SDs. All three readings were c/w osteoporosis.  CT scan in December 2017: Normal neck  05/13/15 Dexa scan 05/13/15: BMD of L1-L2 of the lumbar spine and both forearms. Lowest BMD was that of the left radius, minus 4.6 SDs  03/29/13 Dexa scan: The patient's bone mineral density study performed showed a 2.6% increase of BMD in the spine,   but a 9.4% decrease in the wrist.    ASSESSMENT: 1. Osteoporosis:   A. The patient's osteoporosis is gradually worsening over time. Back in 2009 when she was evaluated by Dr. Valarie Cones, he felt that she had derived all the benefit from bisphosphonates. He did not recommend any further medication therapy.   B. She may now be a candidate for immunotherapy, such as denosumab.  C. I again asked her to increase her calcium and vitamin D to three times daily. Unfortunately, she still only takes the medication two times daily.  She can also take  the gummi-Tums at bedtime if she needs to without worrying about developing hypercalcemia.  2. Vitamin D deficiency: Her 25-OH vitamin D level was normal in March and in June 2017, in September 2018, and in march 2019.  3-4. Secondary hyperparathyroidism/hypocalcemia:    A. Her PTH in June 2017 was normal, but high-normal. Her calcium was low-normal.  The higher PTH reflected the lower calcium level, which in turn was due to lower calcium intake. 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  B. Her subsequent PTH level in February 2018 was normal, but much lower at 26. The level was lower in September 2018 at 21, but higher in March 2019 at 35. Her calcium had decreased to 9.0 in march 2019, which was in the lower quartile.  C. I would like to keep her PTH in the lower half of the normal range, around a level of 25-40 and her calcium in the upper half of the normal range, around a level of 9.5-10.0.  She needs to be very consistent about taking her calcium, to include resuming taking two Tums at bedtime. Marland Kitchen  5-6. Goiter/thyroiditis:   A. Thyroid gland is a bit larger today. The right lobe is uncomfortable to palpation today. The waxing and waning of thyroid gland size and the episodic tenderness/discomfort over time are both c/w intermittent flare ups of Hashimoto's Dz.   B. She said at her prior visit that her [place] in [place] showed three small nodules. The CT scan in December did not show any nodules. I suspect that the radiologist who read the [place] mistook areas of heterogeneity for nodules.   C. Her TSH values in June and December 2017 were normal.  Her TFTs in September 2018 and in March 2019 were also normal.  Although she does not need treatment with thyroid hormone at that time, it is likely that she will eventually need to be treated with thyroid hormone, similar to all of her siblings. 7. Hypertension: Her SBP is normal today, but her DBP is  bit  elevated. She needs to  walk daily.   8. Bradycardia: Her heart rate is normal today. She says that her HR has been fine.   9. Pedal edema: She has no edema today. 10-11: Hyponatremia and hypochloremia: Her sodium was 132, chloride 99, and potassium 4.9 in September 2018. These values were low-normal, but typical for her.    PLAN: 1. Diagnostic: Will repeat her vitamin D, PTH and calcium   2. Therapeutic: Continue losartan at 50 mg each AM. Continue calcium-D twice daily and the gummi-Tums at bedtime. Continue to exercise. Watch salt and fluid intake. 3. Patient education: We discussed the current recommendations from bone mineral density specialists as to calcium and vitamin D intake. I've asked her to try to increase the amount of calcium which she obtains from natural sources, such as milk, but also to take high quality calcium supplements such as Tums and Citracal. I will research whether immunotherapy is appropriate for her. we also discussed the issues of hypertension, edema, goiter, and thyroiditis at length.  4. Follow-up: The patient will have a follow-up appointment in 6 months. She again stated that she wants to continue to see me as long as I'm still practicing.  Level of Service: This visit lasted in excess of 60 minutes. More than 50% of the visit was devoted to counseling.  Molli KnockMichael Brennan, MD, CDE Adult and Pediatric Endocrinology

## 2018-02-22 ENCOUNTER — Encounter (INDEPENDENT_AMBULATORY_CARE_PROVIDER_SITE_OTHER): Payer: Self-pay | Admitting: *Deleted

## 2018-02-22 LAB — PTH, INTACT AND CALCIUM
Calcium: 9 mg/dL (ref 8.6–10.4)
PTH: 36 pg/mL (ref 14–64)

## 2018-02-22 LAB — VITAMIN D 25 HYDROXY (VIT D DEFICIENCY, FRACTURES): VIT D 25 HYDROXY: 39 ng/mL (ref 30–100)

## 2018-04-20 ENCOUNTER — Encounter (INDEPENDENT_AMBULATORY_CARE_PROVIDER_SITE_OTHER): Payer: Self-pay | Admitting: "Endocrinology

## 2018-08-29 ENCOUNTER — Ambulatory Visit (INDEPENDENT_AMBULATORY_CARE_PROVIDER_SITE_OTHER): Payer: Medicare Other | Admitting: "Endocrinology

## 2018-09-30 ENCOUNTER — Telehealth (INDEPENDENT_AMBULATORY_CARE_PROVIDER_SITE_OTHER): Payer: Self-pay | Admitting: "Endocrinology

## 2018-09-30 ENCOUNTER — Encounter (INDEPENDENT_AMBULATORY_CARE_PROVIDER_SITE_OTHER): Payer: Self-pay | Admitting: *Deleted

## 2018-09-30 DIAGNOSIS — M810 Age-related osteoporosis without current pathological fracture: Secondary | ICD-10-CM

## 2018-09-30 DIAGNOSIS — E559 Vitamin D deficiency, unspecified: Secondary | ICD-10-CM

## 2018-09-30 NOTE — Telephone Encounter (Signed)
MYCHART message sent.

## 2018-09-30 NOTE — Telephone Encounter (Signed)
Left voicemail for patient to call back so we can let her know labs have been entered.

## 2018-09-30 NOTE — Telephone Encounter (Signed)
°  Who's calling (name and relationship to patient) : Self Best contact number: [phone] Provider they see: Fransico Michael Reason for call: Please place lab orders so patient can get them completed this coming Monday, 10/03/18.      PRESCRIPTION REFILL ONLY  Name of prescription:  Pharmacy:

## 2018-10-04 LAB — PTH, INTACT AND CALCIUM
Calcium: 9.1 mg/dL (ref 8.6–10.4)
PTH: 43 pg/mL (ref 14–64)

## 2018-10-04 LAB — VITAMIN D 25 HYDROXY (VIT D DEFICIENCY, FRACTURES): Vit D, 25-Hydroxy: 36 ng/mL (ref 30–100)

## 2018-10-06 ENCOUNTER — Encounter (INDEPENDENT_AMBULATORY_CARE_PROVIDER_SITE_OTHER): Payer: Self-pay | Admitting: *Deleted

## 2018-10-07 ENCOUNTER — Encounter

## 2018-10-24 ENCOUNTER — Encounter (INDEPENDENT_AMBULATORY_CARE_PROVIDER_SITE_OTHER): Payer: Self-pay | Admitting: "Endocrinology

## 2018-10-24 ENCOUNTER — Ambulatory Visit (INDEPENDENT_AMBULATORY_CARE_PROVIDER_SITE_OTHER): Payer: Medicare Other | Admitting: "Endocrinology

## 2018-10-24 VITALS — BP 132/84 | HR 76 | Ht 68.11 in | Wt 159.4 lb

## 2018-10-24 DIAGNOSIS — E049 Nontoxic goiter, unspecified: Secondary | ICD-10-CM

## 2018-10-24 DIAGNOSIS — I10 Essential (primary) hypertension: Secondary | ICD-10-CM | POA: Diagnosis not present

## 2018-10-24 DIAGNOSIS — R600 Localized edema: Secondary | ICD-10-CM

## 2018-10-24 DIAGNOSIS — E559 Vitamin D deficiency, unspecified: Secondary | ICD-10-CM

## 2018-10-24 DIAGNOSIS — M810 Age-related osteoporosis without current pathological fracture: Secondary | ICD-10-CM

## 2018-10-24 DIAGNOSIS — E871 Hypo-osmolality and hyponatremia: Secondary | ICD-10-CM

## 2018-10-24 DIAGNOSIS — E211 Secondary hyperparathyroidism, not elsewhere classified: Secondary | ICD-10-CM

## 2018-10-24 DIAGNOSIS — E063 Autoimmune thyroiditis: Secondary | ICD-10-CM

## 2018-10-24 NOTE — Patient Instructions (Signed)
Follow up visit in 3 months. 

## 2018-10-24 NOTE — Progress Notes (Signed)
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 She was unaccompanied for today's visit.   1. The patient was referred to me on 07/15/2005 for evaluation of osteoporosis by her former primary care provider, Dr. [name], M.D.   A. The patient developed bilateral hip joint pains in the late 1990s. In 1996, Dr. [name] of [facility] ordered a bone mineral density study. That study showed significant osteoporosis in the spine and in both hips.  The patient was treated with calcium carbonate and with a multivitamin that included vitamin D. Despite taking these medications and having a reasonably robust exercise regimen, however, the patient's osteoporosis worsened. In 1999 Dr. [name]  performed bilateral hip replacements. Treatment with Fosamax was begun at about that time. In the intervening years bone mineral density had not changed significantly. She was treated with Boniva for several months, but just forgot to take it. Her past medical history revealed that she had had arthritis in many joints for many years. She had undergone natural menopause about 20 years prior. She did take estrogen for one or two years, then stopped that medication. Her surgeries included bilateral hip replacements and bilateral vein stripping. 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 Family history revealed that 5 of her 6 siblings had osteoporosis. Two of those had celiac disease when younger. A sister and brother had thyroid problems as well. [Addendum 04/04/13: In early 2014 all 6 of her siblings were hypothyroid and took thyroid hormone. One of the six siblings, a brother, became hypothyroid after radioactive iodine therapy. A niece has been diagnosed with Graves' disease.]  B. Her  physical examination was completely unremarkable. Laboratory data revealed that her PTH was 75.5, which was at the upper limit of normal. Her calcium was 8.7, which was in the lower 15% of the normal range. Her 25-hydroxy vitamin D was 39 which was normal. Her 1,25 vitamin D was 51 which was also normal. I felt that she had relative hyperparathyroidism due to relatively low calcium and vitamin D intake. I asked her to increase her calcium and vitamin D intake. On subsequent testing on 09/30/2005, her PTH was 37.0, calcium 9.3, 25-vitamin D 47, and 1,25-vitamin D 55.   2. During the past thirteen years the patient has done well overall. Because it was unclear how long she should remain on Fosamax treatment, I recommended that she obtain a second opinion from Dr. Gwendolyn Grant, a bone-mineral expert at the [facility]. She saw Dr. Valarie Cones in June of 2009. He recommended that she not take either Fosamax or Boniva treatments any longer. He felt that she had derived all of the benefit from bisphosphonates that she could. He recommended that she continue her calcium, vitamin D, and exercise. Since then she has had a left hip revision procedure performed.  She continued to have follow-up visits with Dr. [name] in orthopedics. A bone mineral density study performed on 02/16/2011 showed that her bone mineral density probably declined about 2.4-3.7% in the previous 3 years. Since that study she has also had three hip dislocation procedures performed.   3.The patient's last [facility] visit was on 02/21/18.   A. Prior to that visit she had been told to stop her cholesterol medication because her lipids were good. She then decided to stop her BP medication. Her  BP then increased to 180/90, so she is back on losartan, 50 mg/day and her BPs have improved.    B. She had a right total knee replacement in July 2018. Her post-operative swelling and soreness have largely resolved..   C. She had her  last attack of diverticulitis in November 2018. Her surgeon had advised her to have surgery. Her orthopedist told her that he is worried that colonic surgery could result in an infection that might adversely affect her joint replacements.   D. She remains on her gluten-free diet for the most part. She has not been constipated since she began using her new probiotic. She had not noted any further blood on the toilet tissue. She continues to be followed by [place] GI specialist in [place], Dr. Novella RobWilliam W. King, MD. She no longer has LLQ pains.   E. She takes losartan, 50 mg/day. She also takes calcium with vitamin D, 600 mg of Caltrate-D, twice daily or Citracal-D twice daily. She takes two gummi-Tums at bedtime.    F. She has not had much intermittent leg edema.    G. She is better able to walk on both  flat ground and on hills.   H. 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  4. Pertinent Review of Systems: Constitutional: The patient feels "great". She has been much more physically active.   Eyes: Vision is good with her glasses. She had an eye exam in June 2018. There are no significant eye complaints. Neck: The patient has no other complaints of anterior neck swelling, soreness, tenderness,  pressure, discomfort, or difficulty swallowing.  Heart: Heart rate increases with exercise or other physical activity. The patient has no complaints of palpitations, irregular heat beats, chest pain, or chest pressure. Gastrointestinal: As above. She is not having much reflux. The patient has no complaints of excessive hunger, upset stomach, stomach aches or pains, or diarrhea. She has a left inguinal hernia which has not been acting up lately.   Hands: her raynaud's disease is beginning to act up.  Legs: Muscle mass and strength seem normal. She has not had many episodic pains running down her lateral left leg and down into her left foot. There are no other complaints of numbness, tingling,  or burning. She has not had much edema in the legs unless she is sitting or standing for a long period of time.   Feet: There are no obvious foot problems, except for arthritis in her toes. There are no complaints of numbness, tingling, burning, or pain. She has not had much right foot edema since her last visit.   PAST MEDICAL, FAMILY, AND SOCIAL HISTORY: 1. Work and family: The patient retired from [employer] in June 2016 and she and her husband moved to [place] new home [place], [place] in July. There is not a local endocrinologist near her, but there are endocrinologists in [place], about 30 minutes away. I offered to refer her to some endocrinologists in [place], but she wants to continue to see me. 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 2. Activities: She is active physically. She works out at least 3 times per week.    3. Primary care provider: She now sees Dr. Costella [facility]amby [facility] [place] [phone]. 4. Orthopedist: Dr. Macarthur Critchleyom Kelso in [place]. 5. GI: Dr. Novella RobWilliam W. King, [place], in [place] 6. She has the [facility] Medicare Advantage Supplement  REVIEW OF SYSTEMS: There are no other significant problems involving her  other body systems.  PHYSICAL EXAM: BP 132/84   Pulse 76   Ht 5' 8.11" (1.73 m)   Wt 159 lb 6.4 oz (72.3 kg)   BMI 24.16 kg/m   She forgot her BP medication today.  Constitutional: The patient looks healthy, physically and emotionally well.  Her weight is 5 pounds higher. She is alert, bright, and very mentally sharp. It is always a pleasure to see her.  Eyes: There is no arcus or proptosis.  Mouth: The oropharynx appears normal. The tongue appears normal. There is normal oral moisture. There is no obvious gingivitis. Neck: There are no bruits present. The thyroid gland appears normal in size. The thyroid gland has shrunk back to normal size. Both lobes are normal in size today. The consistency of the thyroid gland is normal today. There is no  sensation of uncomfortable pressure or tenderness when I palpate her gland today.  Lungs: The lungs are clear. Air movement is good. Heart: The heart rhythm and rate appear normal. Heart sounds S1 and S2 are normal. I do not appreciate any pathologic heart murmurs. Abdomen: The abdomen is still somewhat enlarged in size, but appropriate for age.  Bowel sounds are normal. The abdomen is soft and non-tender. There is no obviously palpable hepatomegaly, splenomegaly, or other masses.  Arms: Muscle mass appears appropriate for age.  Hands: There is no obvious tremor. Phalangeal and metacarpophalangeal joints appear normal. Palms are normal. Legs: Muscle mass appears appropriate for age. There is 1+ edema of her right shin today.  Neurologic: Muscle strength is normal for age and gender  in both the upper and the lower extremities. Muscle tone appears normal. Sensation to touch is normal in the legs.   Lab Data:  Labs 10/03/18: PTH 43, calcium 9.1, 25-Oh vitamin d 36  Labs 02/21/18: PTH 36, calcium 9.0, 25-OH vitamin D 39   Labs 12/22/17: Sodium 136, chloride 95, potassium 5.0, CO2 24, creatinine 0.87, calcium 9.3; TSH 2.85  Labs 08/23/17: TSH 2.02, free T4 1.0, free T3 2.5; PTH 21 (ref 14-64), calcium 9.1 (ref 8.6-10.4), 25-OH vitamin 42 ; CMP normal except for sodium 132  Labs 01/18/17: PTH 26, calcium 9.0  Labs 11/06/16: CBC normal; CMP normal with sodium 137, chloride  99, and calcium 9.1, cholesterol 151, triglycerides 23, HDL 78, LDL 68; vitamin B12 1,563 (ref 211-946), folate >20 (ref >3.0); TSH 2.290, free T4 1.16, free T3 2.3 (ref 2.0-4.4)  Labs 05/25/16: PTH 64 (ref 14-614), calcium 8.6, 25-OH vitamin D 47; TSH 1.11, free T4 1.1, free T3 1.8 (ref 2.3-4.2)  Labs about 02/14/16: CBC normal, CMP normal, cholesterol 147, triglycerides 40, HDL 74, LDL 65; vitamin B12 1565 (ref 211-946), folate >20 (ref >3); TSH 3.45, free T4 1.23, total T3 77 (ref 71-180); calcium 9.3, 25-OH vitamin D 43.8  Labs  11/15/15: PTH 36, calcium 8.8, 1,25-OH vitamin D 39; CMP normal, except for sodium 130, chloride 94, CO2 33, glucose 140; TSH 2.629, free T4 1.40, free T3 2.4  Labs 04/15/15: Calcium 9.3, PTH 29, 25-OH 46; CMP normal; TSH 2.052, free T4 0.98, free T3 2.6  Labs 10/01/14: PTH 47, calcium 9.3, 25-hydroxy vitamin D 62, CMP with creatinine 1.16; TSH 2.60, free T4 1.14, 2.8  Labs 04/02/14: PTH 43.8, calcium 9.3, 25-hydroxy vitamin D 54, sodium 134; TSH 3.197, free T4 1.05, free T3 2.4  Labs 09/22/33: PTH 34, calcium 9.1, 25-hydroxy vitamin D 58, alkaline phosphatase 54, sodium 134, TSH 1.869, free T4 1.11, free T3 2.4  Labs  02/28/13: PTH 70.5, calcium 9.3, 25-hydroxy vitamin D 50, 1,25-dihydroxy vitamin D 64,   Labs 09/23/12: PTH 34.6, calcium 9.4, 25-hydroxy vitamin D 49, 1,25-dihydroxy vitamin D 53, TSH 1.767, free T4 1.21, free T3 2.6  Labs 03/24/12: PTH 15.3, calcium 9.8, 25-hydroxy vitamin D 56, 1.25-dihydroxy vitamin D 40, TSH 1.254, free T4 1.29, free T3 2.5.    IMAGING:  01/17/18 Dex scan: BMD of L1-L4 is minus 4.1 SDs. BMD of the left radius is minus 5.1 SDs. BMD of the right radius is minus 4.2 SDs. All three readings were c/w osteoporosis.  CT scan in December 2017: Normal neck  05/13/15 Dexa scan 05/13/15: BMD of L1-L2 of the lumbar spine and both forearms. Lowest BMD was that of the left radius, minus 4.6 SDs  03/29/13 Dexa scan: The patient's bone mineral density study performed showed a 2.6% increase of BMD in the spine,   but a 9.4% decrease in the wrist.    ASSESSMENT: 1. Osteoporosis:   A. The patient's osteoporosis is gradually worsening over time. Back in 2009 when she was evaluated by Dr. Valarie Cones, he felt that she had derived all the benefit from bisphosphonates. He did not recommend any further medication therapy. I tried to contact Dr. Jearld Pies several times in August 2019, but was unable to do.   B. She may now be a candidate for immunotherapy, such as denosumab. I discussed this  option for her at length. We discussed the February 2018 Pharmacy and Therapeutics Journal's review of osteoporosis treatment, to include the advantages and disadvantages noted in that review, especially the possibilities of hypocalcemia, ONJ, and AFF. She will talk with her orthopedist to make sure that he does not think there will be a contraindication  C. Her recent PTH value was normal, but higher. Her calcium and vitamin D are still relatively low. I again asked her to increase her calcium and vitamin D to three times daily and to continue to take the gummi-Tums at bedtime.   2. Vitamin D deficiency: Her 25-OH vitamin D level was normal in March and in June 2017, in September 2018, in March 2019, and in October 2109, but still relatively low. I would like to keep her vitamin D in the 45-60 range.   3-4. Secondary hyperparathyroidism/hypocalcemia:    A. Her PTH in June 2017 was normal, but high-normal. Her calcium was low-normal.  The higher PTH reflected the lower calcium level, which in turn was due to lower calcium intake. 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  B. Her subsequent PTH level in February 2018 was normal, but much lower at 26. The level was lower in September 2018 at 21, but higher in March 2019 at 65. Her calcium had decreased to 9.0 in March 2019, which was in the lower quartile. In October 2019 her calcium was normal at 9.1, but her PTH was higher. Taken together, these lab results indicate that she needs more calcium and vitamin D.    C. I would like to keep her PTH in the lower half of the normal range, around a level of 25-40 and her calcium in the upper half of the normal range, around a level of 9.5-10.0.  She needs to be very consistent about taking her calcium, to include resuming taking two Tums at bedtime. Marland Kitchen   5-6. Goiter/thyroiditis:   A. Thyroid gland has shrunk back to normal size today. Both lobes are normal in  size and are not  tender at all today. Her thyroiditis is clinically quiescent.   B. The process of waxing and waning of thyroid gland size and the intermittent tenderness are both c/w evolving Hashimoto's thyroiditis.   C. She said at a prior visit that her [place] in [place] showed three small nodules. The CT scan in December did not show any nodules. I suspect that the radiologist who read the [place] mistook areas of heterogeneity for nodules.   D. Her TSH values in June and December 2017 were normal.  Her TFTs in September 2018 and in March 2019 were also normal.  Although she does not need treatment with thyroid hormone at that time, it is likely that she will eventually need to be treated with thyroid hormone, similar to all of her siblings.  7. Hypertension: Her SBP is normal today, but her DBP is  bit elevated. She did not take her BP medication this morning. She needs to walk daily.    8. Bradycardia: Her heart rate is normal today. She says that her HR has been fine.    9. Pedal edema: She has 1+ edema in the right shin today.  10-11: Hyponatremia and hypochloremia: Her sodium was 132, chloride 99, and potassium 4.9 in September 2018. These values were low-normal, but typical for her. We will re-check her CMP in February.   PLAN: 1. Diagnostic: Will repeat her CMP, vitamin D, PTH and calcium in 3 months.  2. Therapeutic: Continue losartan at 50 mg each AM. Continue calcium-D twice daily and the gummi-Tums at bedtime. Continue to exercise. Watch salt and fluid intake. 3. Patient education: As above. I asked her to try to increase the amount of calcium which she obtains from natural sources, such as milk, but also to take high quality calcium supplements such as Caltrate-D or Citracal-D three times daily and Tums at bedtime. We also discussed the issues of hypertension, edema, goiter, and thyroiditis.  4. Follow-up: The patient will have a follow-up appointment in 3 months. She again stated that  she wants to continue to see me as long as I'm still practicing.  Level of Service: This visit lasted in excess of 65 minutes. More than 50% of the visit was devoted to counseling.  Molli Knock, MD, CDE Adult and Pediatric Endocrinology

## 2018-12-29 ENCOUNTER — Telehealth (INDEPENDENT_AMBULATORY_CARE_PROVIDER_SITE_OTHER): Payer: Self-pay | Admitting: "Endocrinology

## 2018-12-29 NOTE — Telephone Encounter (Signed)
°  Who's calling (name and relationship to patient) : Marylisa (mom) Best contact number: [phone] Provider they see: Fransico Michael  Reason for call: Patient called about a referral to [facility] from [facility].  She stated to fax the referral and notes [phone] so she can schedule appointment.     PRESCRIPTION REFILL ONLY  Name of prescription:  Pharmacy:

## 2018-12-29 NOTE — Telephone Encounter (Signed)
LVM, requested the reason for the referral? Please call tomorrow or send a message thru [employer].

## 2018-12-30 ENCOUNTER — Telehealth (INDEPENDENT_AMBULATORY_CARE_PROVIDER_SITE_OTHER): Payer: Self-pay | Admitting: "Endocrinology

## 2018-12-30 NOTE — Telephone Encounter (Signed)
°  Who's calling (name and relationship to patient) : Danielle Carson (patient) Best contact number: [phone] Provider they see: Fransico Michael  Reason for call: Patient returning call, not changing to Dr Valarie Cones, he is just doing an evaluation and he need as much information as possible.  It is simply a referral.  Please call she will be home after 2:30pm    PRESCRIPTION REFILL ONLY  Name of prescription:  Pharmacy:

## 2018-12-30 NOTE — Telephone Encounter (Signed)
Spoke to patient, advised referral and paperwork sent.
